# Patient Record
Sex: Female | Born: 1990 | Race: Black or African American | Hispanic: No | Marital: Married | Smoking: Never smoker
Health system: Southern US, Community
[De-identification: ages and names within clinical notes are randomized; demographics above are authoritative.]

## PROBLEM LIST (undated history)

## (undated) ENCOUNTER — Inpatient Hospital Stay (HOSPITAL_COMMUNITY): Payer: Self-pay

## (undated) DIAGNOSIS — O139 Gestational [pregnancy-induced] hypertension without significant proteinuria, unspecified trimester: Secondary | ICD-10-CM

## (undated) DIAGNOSIS — Z789 Other specified health status: Secondary | ICD-10-CM

## (undated) HISTORY — DX: Gestational (pregnancy-induced) hypertension without significant proteinuria, unspecified trimester: O13.9

## (undated) HISTORY — PX: NO PAST SURGERIES: SHX2092

---

## 2012-01-06 ENCOUNTER — Encounter (HOSPITAL_COMMUNITY): Payer: Self-pay | Admitting: Emergency Medicine

## 2012-01-06 ENCOUNTER — Emergency Department (HOSPITAL_COMMUNITY)
Admission: EM | Admit: 2012-01-06 | Discharge: 2012-01-07 | Disposition: A | Discharge status: Home / Self Care | Attending: Emergency Medicine | Admitting: Emergency Medicine

## 2012-01-06 DIAGNOSIS — Z3202 Encounter for pregnancy test, result negative: Secondary | ICD-10-CM | POA: Insufficient documentation

## 2012-01-06 DIAGNOSIS — Z791 Long term (current) use of non-steroidal anti-inflammatories (NSAID): Secondary | ICD-10-CM | POA: Insufficient documentation

## 2012-01-06 DIAGNOSIS — R112 Nausea with vomiting, unspecified: Secondary | ICD-10-CM

## 2012-01-06 DIAGNOSIS — N760 Acute vaginitis: Secondary | ICD-10-CM | POA: Insufficient documentation

## 2012-01-06 DIAGNOSIS — N390 Urinary tract infection, site not specified: Secondary | ICD-10-CM | POA: Insufficient documentation

## 2012-01-06 LAB — CBC WITH DIFFERENTIAL/PLATELET
Basophils Absolute: 0 10*3/uL (ref 0.0–0.1)
Basophils Relative: 0 % (ref 0–1)
Eosinophils Absolute: 0.1 10*3/uL (ref 0.0–0.7)
Hemoglobin: 10.6 g/dL — ABNORMAL LOW (ref 12.0–15.0)
MCH: 25.9 pg — ABNORMAL LOW (ref 26.0–34.0)
MCHC: 32.9 g/dL (ref 30.0–36.0)
Neutro Abs: 4.4 10*3/uL (ref 1.7–7.7)
Neutrophils Relative %: 54 % (ref 43–77)
Platelets: 430 10*3/uL — ABNORMAL HIGH (ref 150–400)
RDW: 14.4 % (ref 11.5–15.5)

## 2012-01-06 LAB — URINALYSIS, MICROSCOPIC ONLY
Nitrite: NEGATIVE
Protein, ur: 30 mg/dL — AB
Specific Gravity, Urine: 1.015 (ref 1.005–1.030)
Urobilinogen, UA: 1 mg/dL (ref 0.0–1.0)

## 2012-01-06 LAB — WET PREP, GENITAL
Trich, Wet Prep: NONE SEEN
Yeast Wet Prep HPF POC: NONE SEEN

## 2012-01-06 LAB — POCT PREGNANCY, URINE: Preg Test, Ur: NEGATIVE

## 2012-01-06 LAB — BASIC METABOLIC PANEL
CO2: 25 mEq/L (ref 19–32)
Chloride: 105 mEq/L (ref 96–112)
Creatinine, Ser: 0.71 mg/dL (ref 0.50–1.10)

## 2012-01-06 MED ORDER — ONDANSETRON 4 MG PO TBDP
4.0000 mg | ORAL_TABLET | Freq: Once | ORAL | Status: AC
  Administered 2012-01-06: 4 mg via ORAL
  Filled 2012-01-06: qty 1

## 2012-01-06 MED ORDER — HYDROCODONE-ACETAMINOPHEN 5-325 MG PO TABS
2.0000 | ORAL_TABLET | Freq: Once | ORAL | Status: AC
  Administered 2012-01-06: 2 via ORAL
  Filled 2012-01-06: qty 2

## 2012-01-06 NOTE — ED Notes (Signed)
Pt sts that she started to have general pain "all over" that started today. Pt also has been vomiting today. Denies any abd pain. Pt is currently on her menstrual cycle. Nothing makes pain better or worse. Pt has never had pain like this in the past.

## 2012-01-06 NOTE — ED Provider Notes (Signed)
History     CSN: [id number]  Arrival date & time 01/06/12  8295   First MD Initiated Contact with Patient 01/06/12 2244      Chief Complaint  Patient presents with  . Emesis    (Consider location/radiation/quality/duration/timing/severity/associated sxs/prior treatment) HPI Comments:  This is a 21 year old female, who presents emergency department with chief complaint of nausea and vomiting. Patient states that her symptoms have been improving over the course of the day. She denies chest pain, shortness of breath, abdominal pain, constipation, diarrhea, vaginal discharge, dysuria. She endorses nausea and vomiting. She has not tried anything to alleviate her symptoms. She has diffuse abdominal pain without rebound tenderness, no localized tenderness over McBurney's point, or upper right quadrant tenderness. Her pain is moderate.  The history is provided by the patient. No language interpreter was used.    History reviewed. No pertinent past medical history.  History reviewed. No pertinent past surgical history.  History reviewed. No pertinent family history.  History  Substance Use Topics  . Smoking status: Never Smoker   . Smokeless tobacco: Never Used  . Alcohol Use: No    OB History    Grav Para Term Preterm Abortions TAB SAB Ect Mult Living                  Review of Systems  All other systems reviewed and are negative.    Allergies  Review of patient's allergies indicates no known allergies.  Home Medications   Current Outpatient Rx  Name  Route  Sig  Dispense  Refill  . ACETAMINOPHEN 325 MG PO TABS   Oral   Take 650 mg by mouth every 6 (six) hours as needed. For fever/pain         . IBUPROFEN 200 MG PO CAPS   Oral   Take 1 capsule by mouth daily as needed. For pain/crampinf           BP 137/92  Pulse 81  Temp 98.3 F (36.8 C) (Oral)  Resp 18  SpO2 100%  LMP 01/05/2012  Physical Exam  Nursing note and vitals reviewed. Constitutional: She  is oriented to person, place, and time. She appears well-developed and well-nourished.  HENT:  Head: Normocephalic and atraumatic.  Eyes: Conjunctivae normal and EOM are normal. Pupils are equal, round, and reactive to light.  Neck: Normal range of motion. Neck supple.  Cardiovascular: Normal rate and regular rhythm.  Exam reveals no gallop and no friction rub.   No murmur heard. Pulmonary/Chest: Effort normal and breath sounds normal. No respiratory distress. She has no wheezes. She has no rales. She exhibits no tenderness.  Abdominal: Soft. Bowel sounds are normal. She exhibits no distension and no mass. There is no tenderness. There is no rebound and no guarding.  Genitourinary: There is no rash, tenderness, lesion or injury on the right labia. There is no rash, tenderness, lesion or injury on the left labia. Cervix exhibits no motion tenderness, no discharge and no friability. Right adnexum displays no mass, no tenderness and no fullness. Left adnexum displays no mass, no tenderness and no fullness. There is bleeding around the vagina. No erythema or tenderness around the vagina. No foreign body around the vagina. No signs of injury around the vagina. No vaginal discharge found.  Musculoskeletal: Normal range of motion. She exhibits no edema and no tenderness.  Neurological: She is alert and oriented to person, place, and time.  Skin: Skin is warm and dry.  Psychiatric: She has a  normal mood and affect. Her behavior is normal. Judgment and thought content normal.    ED Course  Procedures (including critical care time)  Labs Reviewed  CBC WITH DIFFERENTIAL - Abnormal; Notable for the following:    Hemoglobin 10.6 (*)     HCT 32.2 (*)     MCH 25.9 (*)     Platelets 430 (*)     All other components within normal limits  URINALYSIS, MICROSCOPIC ONLY - Abnormal; Notable for the following:    Color, Urine RED (*)  BIOCHEMICALS MAY BE AFFECTED BY COLOR   APPearance CLOUDY (*)     Hgb urine  dipstick LARGE (*)     Ketones, ur 15 (*)     Protein, ur 30 (*)     Leukocytes, UA SMALL (*)     Bacteria, UA FEW (*)     All other components within normal limits  POCT PREGNANCY, URINE  BASIC METABOLIC PANEL  OCCULT BLOOD, POC DEVICE  URINE CULTURE  WET PREP, GENITAL  GC/CHLAMYDIA PROBE AMP   Results for orders placed during the hospital encounter of 01/06/12  CBC WITH DIFFERENTIAL      Component Value Range   WBC 8.1  4.0 - 10.5 K/uL   RBC 4.10  3.87 - 5.11 MIL/uL   Hemoglobin 10.6 (*) 12.0 - 15.0 g/dL   HCT 16.1 (*) 09.6 - 04.5 %   MCV 78.5  78.0 - 100.0 fL   MCH 25.9 (*) 26.0 - 34.0 pg   MCHC 32.9  30.0 - 36.0 g/dL   RDW 40.9  81.1 - 91.4 %   Platelets 430 (*) 150 - 400 K/uL   Neutrophils Relative 54  43 - 77 %   Neutro Abs 4.4  1.7 - 7.7 K/uL   Lymphocytes Relative 34  12 - 46 %   Lymphs Abs 2.7  0.7 - 4.0 K/uL   Monocytes Relative 10  3 - 12 %   Monocytes Absolute 0.8  0.1 - 1.0 K/uL   Eosinophils Relative 2  0 - 5 %   Eosinophils Absolute 0.1  0.0 - 0.7 K/uL   Basophils Relative 0  0 - 1 %   Basophils Absolute 0.0  0.0 - 0.1 K/uL  URINALYSIS, MICROSCOPIC ONLY      Component Value Range   Color, Urine RED (*) YELLOW   APPearance CLOUDY (*) CLEAR   Specific Gravity, Urine 1.015  1.005 - 1.030   pH 7.0  5.0 - 8.0   Glucose, UA NEGATIVE  NEGATIVE mg/dL   Hgb urine dipstick LARGE (*) NEGATIVE   Bilirubin Urine NEGATIVE  NEGATIVE   Ketones, ur 15 (*) NEGATIVE mg/dL   Protein, ur 30 (*) NEGATIVE mg/dL   Urobilinogen, UA 1.0  0.0 - 1.0 mg/dL   Nitrite NEGATIVE  NEGATIVE   Leukocytes, UA SMALL (*) NEGATIVE   WBC, UA 3-6  <3 WBC/hpf   RBC / HPF TOO NUMEROUS TO COUNT  <3 RBC/hpf   Bacteria, UA FEW (*) RARE   Squamous Epithelial / LPF RARE  RARE  POCT PREGNANCY, URINE      Component Value Range   Preg Test, Ur NEGATIVE  NEGATIVE  BASIC METABOLIC PANEL      Component Value Range   Sodium 139  135 - 145 mEq/L   Potassium 4.0  3.5 - 5.1 mEq/L   Chloride 105  96 -  112 mEq/L   CO2 25  19 - 32 mEq/L   Glucose, Bld 83  70 - 99 mg/dL   BUN 13  6 - 23 mg/dL   Creatinine, Ser 1.61  0.50 - 1.10 mg/dL   Calcium 9.0  8.4 - 09.6 mg/dL   GFR calc non Af Amer >90  >90 mL/min   GFR calc Af Amer >90  >90 mL/min  WET PREP, GENITAL      Component Value Range   Yeast Wet Prep HPF POC NONE SEEN  NONE SEEN   Trich, Wet Prep NONE SEEN  NONE SEEN   Clue Cells Wet Prep HPF POC FEW (*) NONE SEEN   WBC, Wet Prep HPF POC FEW (*) NONE SEEN  OCCULT BLOOD, POC DEVICE      Component Value Range   Fecal Occult Bld NEGATIVE        1. UTI (lower urinary tract infection)   2. BV (bacterial vaginosis)   3. N&V (nausea and vomiting)       MDM  21 year old female with nausea and vomiting. Patient also has a UTI. Have discussed this patient with Dr. Preston Fleeting.  I'm going to discharge the patient to home with primary care followup. I will give her nausea and pain medication. Patient is stable and ready for discharge. Will treat UTI with Bactrim.  Will treat BV with metronidazole.  Will give a few norco and zofran for pain and nausea.  Patient encouraged to f/u with PCP or student health.        Roxy Horseman, PA-C 01/07/12 0008

## 2012-01-07 LAB — URINE CULTURE

## 2012-01-07 LAB — GC/CHLAMYDIA PROBE AMP: CT Probe RNA: NEGATIVE

## 2012-01-07 MED ORDER — METRONIDAZOLE 500 MG PO TABS: 500.0000 mg | ORAL_TABLET | Freq: Two times a day (BID) | ORAL | Status: DC

## 2012-01-07 MED ORDER — HYDROCODONE-ACETAMINOPHEN 5-325 MG PO TABS: 2.0000 | ORAL_TABLET | ORAL | Status: DC | PRN

## 2012-01-07 MED ORDER — SULFAMETHOXAZOLE-TRIMETHOPRIM 800-160 MG PO TABS: 1.0000 | ORAL_TABLET | Freq: Two times a day (BID) | ORAL | Status: DC

## 2012-01-07 MED ORDER — ONDANSETRON HCL 4 MG PO TABS: 4.0000 mg | ORAL_TABLET | Freq: Four times a day (QID) | ORAL | Status: DC

## 2012-01-07 NOTE — ED Provider Notes (Signed)
Medical screening examination/treatment/procedure(s) were performed by non-physician practitioner and as supervising physician I was immediately available for consultation/collaboration.   Graeme Menees, MD 01/07/12 1641 

## 2012-12-15 ENCOUNTER — Emergency Department (INDEPENDENT_AMBULATORY_CARE_PROVIDER_SITE_OTHER)
Admission: EM | Admit: 2012-12-15 | Discharge: 2012-12-15 | Disposition: A | Discharge status: Home / Self Care | Payer: BC Managed Care – PPO | Source: Home / Self Care | Attending: Family Medicine | Admitting: Family Medicine

## 2012-12-15 ENCOUNTER — Encounter (HOSPITAL_COMMUNITY): Payer: Self-pay | Admitting: Emergency Medicine

## 2012-12-15 DIAGNOSIS — O2691 Pregnancy related conditions, unspecified, first trimester: Secondary | ICD-10-CM

## 2012-12-15 DIAGNOSIS — O2690 Pregnancy related conditions, unspecified, unspecified trimester: Secondary | ICD-10-CM

## 2012-12-15 LAB — POCT URINALYSIS DIP (DEVICE)
Glucose, UA: NEGATIVE mg/dL
Hgb urine dipstick: NEGATIVE
Nitrite: NEGATIVE
Protein, ur: NEGATIVE mg/dL
Urobilinogen, UA: 0.2 mg/dL (ref 0.0–1.0)
pH: 7 (ref 5.0–8.0)

## 2012-12-15 NOTE — ED Provider Notes (Signed)
CSN: [id number]     Arrival date & time 12/15/12  1350 History   First MD Initiated Contact with Patient 12/15/12 1506     Chief Complaint  Patient presents with  . Nausea   (Consider location/radiation/quality/duration/timing/severity/associated sxs/prior Treatment) Patient is a 22 y.o. female presenting with female genitourinary complaint. The history is provided by the patient.  Female GU Problem This is a new problem. The current episode started more than 1 week ago. The problem has not changed since onset.Pertinent negatives include no chest pain and no abdominal pain. Associated symptoms comments: Nausea, no vomiting, no gu sx, no fever.Marland Kitchen    History reviewed. No pertinent past medical history. History reviewed. No pertinent past surgical history. History reviewed. No pertinent family history. History  Substance Use Topics  . Smoking status: Never Smoker   . Smokeless tobacco: Never Used  . Alcohol Use: No   OB History   Grav Para Term Preterm Abortions TAB SAB Ect Mult Living                 Review of Systems  Constitutional: Negative.   Cardiovascular: Negative for chest pain.  Gastrointestinal: Positive for nausea. Negative for vomiting, abdominal pain, diarrhea and constipation.  Genitourinary: Negative for urgency, vaginal bleeding, vaginal pain and pelvic pain.    Allergies  Review of patient's allergies indicates no known allergies.  Home Medications   Current Outpatient Rx  Name  Route  Sig  Dispense  Refill  . acetaminophen (TYLENOL) 325 MG tablet   Oral   Take 650 mg by mouth every 6 (six) hours as needed. For fever/pain         . HYDROcodone-acetaminophen (NORCO/VICODIN) 5-325 MG per tablet   Oral   Take 2 tablets by mouth every 4 (four) hours as needed for pain.   6 tablet   0   . Ibuprofen (MIDOL) 200 MG CAPS   Oral   Take 1 capsule by mouth daily as needed. For pain/crampinf         . metroNIDAZOLE (FLAGYL) 500 MG tablet   Oral   Take 1  tablet (500 mg total) by mouth 2 (two) times daily.   14 tablet   0   . ondansetron (ZOFRAN) 4 MG tablet   Oral   Take 1 tablet (4 mg total) by mouth every 6 (six) hours.   12 tablet   0   . sulfamethoxazole-trimethoprim (SEPTRA DS) 800-160 MG per tablet   Oral   Take 1 tablet by mouth every 12 (twelve) hours.   20 tablet   0    BP 129/57  Pulse 89  Temp(Src) 98.2 F (36.8 C) (Oral)  Resp 16  SpO2 100% Physical Exam  Nursing note and vitals reviewed. Constitutional: She is oriented to person, place, and time. She appears well-developed and well-nourished.  Abdominal: Soft. Bowel sounds are normal. She exhibits no distension and no mass. There is no tenderness. There is no rebound and no guarding.  Neurological: She is alert and oriented to person, place, and time.  Skin: Skin is warm and dry.    ED Course  Procedures (including critical care time) Labs Review Labs Reviewed  POCT URINALYSIS DIP (DEVICE) - Abnormal; Notable for the following:    Leukocytes, UA SMALL (*)    All other components within normal limits  POCT PREGNANCY, URINE - Abnormal; Notable for the following:    Preg Test, Ur POSITIVE (*)    All other components within normal limits  Imaging Review No results found.    MDM      Linna Hoff, MD 12/15/12 1540

## 2012-12-15 NOTE — ED Notes (Signed)
Pt  Reports      Vomiting         Mild  abd pain  Since  Last  Week            Pt  Reports  She  Is  Late on her  Period    Sexually  Active  No  Birth  Control

## 2013-01-12 ENCOUNTER — Encounter: Payer: Self-pay | Admitting: Obstetrics & Gynecology

## 2013-01-12 ENCOUNTER — Ambulatory Visit (INDEPENDENT_AMBULATORY_CARE_PROVIDER_SITE_OTHER): Payer: BC Managed Care – PPO | Admitting: Obstetrics & Gynecology

## 2013-01-12 VITALS — BP 108/78 | Temp 96.3°F | Ht 66.0 in | Wt 270.1 lb

## 2013-01-12 DIAGNOSIS — O99211 Obesity complicating pregnancy, first trimester: Secondary | ICD-10-CM

## 2013-01-12 DIAGNOSIS — E669 Obesity, unspecified: Secondary | ICD-10-CM

## 2013-01-12 DIAGNOSIS — Z3401 Encounter for supervision of normal first pregnancy, first trimester: Secondary | ICD-10-CM

## 2013-01-12 DIAGNOSIS — Z23 Encounter for immunization: Secondary | ICD-10-CM

## 2013-01-12 LAB — HIV ANTIBODY (ROUTINE TESTING W REFLEX): HIV: NONREACTIVE

## 2013-01-12 LAB — POCT URINALYSIS DIP (DEVICE)
Hgb urine dipstick: NEGATIVE
Nitrite: NEGATIVE
Protein, ur: NEGATIVE mg/dL
Urobilinogen, UA: 1 mg/dL (ref 0.0–1.0)
pH: 7 (ref 5.0–8.0)

## 2013-01-12 LAB — OB RESULTS CONSOLE GC/CHLAMYDIA
CHLAMYDIA, DNA PROBE: POSITIVE
Gonorrhea: NEGATIVE

## 2013-01-12 NOTE — Addendum Note (Signed)
Addended by: Louanna Raw on: 01/12/2013 04:14 PM   Modules accepted: Orders

## 2013-01-12 NOTE — Progress Notes (Signed)
Subjective:    Karen Christensen is being seen today for her first obstetrical visit.  This is not a planned pregnancy. She is at [redacted]w[redacted]d gestation. Her obstetrical history is significant for obesity. Relationship with FOB: significant other, not living together. Patient does intend to breast feed. Pregnancy history fully reviewed.  Menstrual History: OB History   Grav Para Term Preterm Abortions TAB SAB Ect Mult Living   1                Patient's last menstrual period was 11/11/2012.    The following portions of the patient's history were reviewed and updated as appropriate: allergies, current medications, past family history, past medical history, past social history, past surgical history and problem list.  Review of Systems A comprehensive review of systems was negative.    Objective:    BP 108/78  Temp(Src) 96.3 F (35.7 C)  Ht 5\' 6"  (1.676 m)  Wt 270 lb 1.6 oz (122.517 kg)  BMI 43.62 kg/m2  LMP 11/11/2012  General Appearance:    Alert, cooperative, no distress, appears stated age  Head:    Normocephalic, without obvious abnormality, atraumatic           Throat:   Lips, mucosa, and tongue normal; teeth and gums normal  Neck:   Supple, symmetrical, trachea midline, no adenopathy;    thyroid:  no enlargement/tenderness/nodules; no carotid   bruit or JVD; acanthosis nigricans  Back:     Symmetric, no curvature, ROM normal, no CVA tenderness  Lungs:     Clear to auscultation bilaterally, respirations unlabored  Chest Wall:    No tenderness or deformity   Heart:    Regular rate and rhythm, S1 and S2 normal, no murmur, rub   or gallop  Breast Exam:    No tenderness, masses, or nipple abnormality  Abdomen:     Soft, non-tender, bowel sounds active all four quadrants,    no masses, no organomegaly  Genitalia:    Normal female without lesion, discharge or tenderness  Rectal:    Normal tone, normal prostate, no masses or tenderness;   guaiac negative stool  Extremities:    Extremities normal, atraumatic, no cyanosis or edema  Pulses:   2+ and symmetric all extremities  Skin:   Skin color, texture, turgor normal, no rashes or lesions            Assessment:    Pregnancy at 8 and 6/7 weeks  Obesity- early 1 hr GTT done today    Plan:    Initial labs drawn. Prenatal vitamins. Problem list reviewed and updated. AFP3 discussed: requested. Role of ultrasound in pregnancy discussed; fetal survey: requested. Amniocentesis discussed: not indicated. Follow up in 4 weeks. 50% of 20 min visit spent on counseling and coordination of care.  Flu vac Early 1 hour GTT done today

## 2013-01-12 NOTE — Progress Notes (Signed)
P= 1001 Initiat prenatal visit, needs prescription for prenatal vitamins, c/o of cramping last Saturday denies any bleeding.

## 2013-01-12 NOTE — Patient Instructions (Addendum)
Pregnancy - First Trimester During sexual intercourse, millions of sperm go into the vagina. Only 1 sperm will penetrate and fertilize the female egg while it is in the Fallopian tube. One week later, the fertilized egg implants into the wall of the uterus. An embryo begins to develop into a baby. At 6 to 8 weeks, the eyes and face are formed and the heartbeat can be seen on ultrasound. At the end of 12 weeks (first trimester), all the baby's organs are formed. Now that you are pregnant, you will want to do everything you can to have a healthy baby. Two of the most important things are to get good prenatal care and follow your caregiver's instructions. Prenatal care is all the medical care you receive before the baby's birth. It is given to prevent, find, and treat problems during the pregnancy and childbirth. PRENATAL EXAMS  During prenatal visits, your weight, blood pressure, and urine are checked. This is done to make sure you are healthy and progressing normally during the pregnancy.  A pregnant woman should gain 25 to 35 pounds during the pregnancy. However, if you are overweight or underweight, your caregiver will advise you regarding your weight.  Your caregiver will ask and answer questions for you.  Blood work, cervical cultures, other necessary tests, and a Pap test are done during your prenatal exams. These tests are done to check on your health and the probable health of your baby. Tests are strongly recommended and done for HIV with your permission. This is the virus that causes AIDS. These tests are done because medicines can be given to help prevent your baby from being born with this infection should you have been infected without knowing it. Blood work is also used to find out your blood type, previous infections, and follow your blood levels (hemoglobin).  Low hemoglobin (anemia) is common during pregnancy. Iron and vitamins are given to help prevent this. Later in the pregnancy, blood  tests for diabetes will be done along with any other tests if any problems develop.  You may need other tests to make sure you and the baby are doing well. CHANGES DURING THE FIRST TRIMESTER  Your body goes through many changes during pregnancy. They vary from person to person. Talk to your caregiver about changes you notice and are concerned about. Changes can include:  Your menstrual period stops.  The egg and sperm carry the genes that determine what you look like. Genes from you and your partner are forming a baby. The female genes determine whether the baby is a boy or a girl.  Your body increases in girth and you may feel bloated.  Feeling sick to your stomach (nauseous) and throwing up (vomiting). If the vomiting is uncontrollable, call your caregiver.  Your breasts will begin to enlarge and become tender.  Your nipples may stick out more and become darker.  The need to urinate more. Painful urination may mean you have a bladder infection.  Tiring easily.  Loss of appetite.  Cravings for certain kinds of food.  At first, you may gain or lose a couple of pounds.  You may have changes in your emotions from day to day (excited to be pregnant or concerned something may go wrong with the pregnancy and baby).  You may have more vivid and strange dreams. HOME CARE INSTRUCTIONS   It is very important to avoid all smoking, alcohol and non-prescribed drugs during your pregnancy. These affect the formation and growth of the baby.   Avoid chemicals while pregnant to ensure the delivery of a healthy infant.  Start your prenatal visits by the 12th week of pregnancy. They are usually scheduled monthly at first, then more often in the last 2 months before delivery. Keep your caregiver's appointments. Follow your caregiver's instructions regarding medicine use, blood and lab tests, exercise, and diet.  During pregnancy, you are providing food for you and your baby. Eat regular, well-balanced  meals. Choose foods such as meat, fish, milk and other low fat dairy products, vegetables, fruits, and whole-grain breads and cereals. Your caregiver will tell you of the ideal weight gain.  You can help morning sickness by keeping soda crackers at the bedside. Eat a couple before arising in the morning. You may want to use the crackers without salt on them.  Eating 4 to 5 small meals rather than 3 large meals a day also may help the nausea and vomiting.  Drinking liquids between meals instead of during meals also seems to help nausea and vomiting.  A physical sexual relationship may be continued throughout pregnancy if there are no other problems. Problems may be early (premature) leaking of amniotic fluid from the membranes, vaginal bleeding, or belly (abdominal) pain.  Exercise regularly if there are no restrictions. Check with your caregiver or physical therapist if you are unsure of the safety of some of your exercises. Greater weight gain will occur in the last 2 trimesters of pregnancy. Exercising will help:  Control your weight.  Keep you in shape.  Prepare you for labor and delivery.  Help you lose your pregnancy weight after you deliver your baby.  Wear a good support or jogging bra for breast tenderness during pregnancy. This may help if worn during sleep too.  Ask when prenatal classes are available. Begin classes when they are offered.  Do not use hot tubs, steam rooms, or saunas.  Wear your seat belt when driving. This protects you and your baby if you are in an accident.  Avoid raw meat, uncooked cheese, cat litter boxes, and soil used by cats throughout the pregnancy. These carry germs that can cause birth defects in the baby.  The first trimester is a good time to visit your dentist for your dental health. Getting your teeth cleaned is okay. Use a softer toothbrush and brush gently during pregnancy.  Ask for help if you have financial, counseling, or nutritional needs  during pregnancy. Your caregiver will be able to offer counseling for these needs as well as refer you for other special needs.  Do not take any medicines or herbs unless told by your caregiver.  Inform your caregiver if there is any mental or physical domestic violence.  Make a list of emergency phone numbers of family, friends, hospital, and police and fire departments.  Write down your questions. Take them to your prenatal visit.  Do not douche.  Do not cross your legs.  If you have to stand for long periods of time, rotate you feet or take small steps in a circle.  You may have more vaginal secretions that may require a sanitary pad. Do not use tampons or scented sanitary pads. MEDICINES AND DRUG USE IN PREGNANCY  Take prenatal vitamins as directed. The vitamin should contain 1 milligram of folic acid. Keep all vitamins out of reach of children. Only a couple vitamins or tablets containing iron may be fatal to a baby or young child when ingested.  Avoid use of all medicines, including herbs, over-the-counter medicines, not   prescribed or suggested by your caregiver. Only take over-the-counter or prescription medicines for pain, discomfort, or fever as directed by your caregiver. Do not use aspirin, ibuprofen, or naproxen unless directed by your caregiver.  Let your caregiver also know about herbs you may be using.  Alcohol is related to a number of birth defects. This includes fetal alcohol syndrome. All alcohol, in any form, should be avoided completely. Smoking will cause low birth rate and premature babies.  Street or illegal drugs are very harmful to the baby. They are absolutely forbidden. A baby born to an addicted mother will be addicted at birth. The baby will go through the same withdrawal an adult does.  Let your caregiver know about any medicines that you have to take and for what reason you take them. SEEK MEDICAL CARE IF:  You have any concerns or worries during your  pregnancy. It is better to call with your questions if you feel they cannot wait, rather than worry about them. SEEK IMMEDIATE MEDICAL CARE IF:   An unexplained oral temperature above 102 F (38.9 C) develops, or as your caregiver suggests.  You have leaking of fluid from the vagina (birth canal). If leaking membranes are suspected, take your temperature and inform your caregiver of this when you call.  There is vaginal spotting or bleeding. Notify your caregiver of the amount and how many pads are used.  You develop a bad smelling vaginal discharge with a change in the color.  You continue to feel sick to your stomach (nauseated) and have no relief from remedies suggested. You vomit blood or coffee ground-like materials.  You lose more than 2 pounds of weight in 1 week.  You gain more than 2 pounds of weight in 1 week and you notice swelling of your face, hands, feet, or legs.  You gain 5 pounds or more in 1 week (even if you do not have swelling of your hands, face, legs, or feet).  You get exposed to [place] measles and have never had them.  You are exposed to fifth disease or chickenpox.  You develop belly (abdominal) pain. Round ligament discomfort is a common non-cancerous (benign) cause of abdominal pain in pregnancy. Your caregiver still must evaluate this.  You develop headache, fever, diarrhea, pain with urination, or shortness of breath.  You fall or are in a car accident or have any kind of trauma.  There is mental or physical violence in your home. Document Released: 01/29/2001 Document Revised: 10/30/2011 Document Reviewed: 08/02/2008 [facility] Patient Information 2014 [place], [place]. Glucose Tolerance Test During Pregnancy The glucose tolerance test (GTT) or 3-hour glucose test can be used to determine if a woman has diabetes that first begins or is first recognized during pregnancy (gestational diabetes). Typically, a GTT is done after you have had a 1-hour glucose  test with results that indicate you possibly have gestational diabetes.  The test takes about 3 hours. There will be a series of blood tests after you drink the sugar water solution. You must remain at the testing location to make sure that your blood is drawn on time.  LET YOUR CAREGIVER KNOW ABOUT:  Allergies to food or medicine.  Medicines taken, including vitamins, herbs, eyedrops, over-the-counter medicines, and creams.  Any recent illnesses or infections. BEFORE THE PROCEDURE The GTT is a fasting test, meaning you must stop eating for a certain amount of time. The test will be the most accurate if you have not eaten for 8 12 hours before  the test. For this reason, it is recommended that you have this test done in the morning before you have breakfast. PROCEDURE  Do not eat or drink anything but water during the test. When you arrive at the lab, a sample of your blood is taken to get your fasting blood glucose level. After your fasting glucose level is determined, you will be given a sugar water solution to drink. You will be asked to wait in a certain area until your next blood test. The blood tests are done each hour for 3 hours. Stay close to the lab so your blood samples can be taken on time. This is important. If the blood samples are not taken on time, the test will need to be done again on another day.  AFTER THE PROCEDURE  You can eat and drink as usual.   Ask when your test results will be ready. Make sure you get your test results. A positive test is considered when two of the four blood test values are equal or above the normal blood glucose level. Document Released: 08/06/2011 Document Reviewed: 08/06/2011 [facility] Patient Information 2014 [place], [place].

## 2013-01-13 ENCOUNTER — Encounter: Payer: Self-pay | Admitting: Obstetrics & Gynecology

## 2013-01-13 LAB — PRESCRIPTION MONITORING PROFILE (19 PANEL)
Carisoprodol, Urine: NEGATIVE ng/mL
Cocaine Metabolites: NEGATIVE ng/mL
Creatinine, Urine: 138.85 mg/dL (ref >20.0)
Fentanyl, Ur: NEGATIVE ng/mL
MDMA URINE: NEGATIVE ng/mL
Meperidine, Ur: NEGATIVE ng/mL
Nitrites, Initial: NEGATIVE ug/mL
Oxycodone Screen, Ur: NEGATIVE ng/mL
Propoxyphene: NEGATIVE ng/mL
Tramadol Scrn, Ur: NEGATIVE ng/mL
pH, Initial: 7.1 pH (ref 4.5–8.9)

## 2013-01-13 LAB — OBSTETRIC PANEL
Basophils Relative: 0 % (ref 0–1)
Eosinophils Absolute: 0.1 10*3/uL (ref 0.0–0.7)
Eosinophils Relative: 1 % (ref 0–5)
Hepatitis B Surface Ag: NEGATIVE
MCH: 24.2 pg — ABNORMAL LOW (ref 26.0–34.0)
MCHC: 32.6 g/dL (ref 30.0–36.0)
Neutrophils Relative %: 63 % (ref 43–77)
Platelets: 472 10*3/uL — ABNORMAL HIGH (ref 150–400)
Rh Type: POSITIVE

## 2013-01-13 LAB — CULTURE, OB URINE

## 2013-01-13 LAB — ALCOHOL METABOLITE (ETG), URINE: Ethyl Glucuronide (EtG): NEGATIVE ng/mL

## 2013-01-15 LAB — HEMOGLOBINOPATHY EVALUATION
Hemoglobin Other: 0 %
Hgb A2 Quant: 2.5 % (ref 2.2–3.2)
Hgb A: 97.5 % (ref 96.8–97.8)
Hgb S Quant: 0 %

## 2013-01-20 ENCOUNTER — Encounter: Payer: Self-pay | Admitting: *Deleted

## 2013-01-20 ENCOUNTER — Ambulatory Visit (HOSPITAL_COMMUNITY)
Admission: RE | Admit: 2013-01-20 | Discharge: 2013-01-20 | Disposition: A | Discharge status: Home / Self Care | Payer: BC Managed Care – PPO | Source: Ambulatory Visit | Attending: Obstetrics & Gynecology | Admitting: Obstetrics & Gynecology

## 2013-01-20 DIAGNOSIS — Z3401 Encounter for supervision of normal first pregnancy, first trimester: Secondary | ICD-10-CM

## 2013-01-20 DIAGNOSIS — Z3480 Encounter for supervision of other normal pregnancy, unspecified trimester: Secondary | ICD-10-CM | POA: Insufficient documentation

## 2013-01-20 DIAGNOSIS — E669 Obesity, unspecified: Secondary | ICD-10-CM | POA: Insufficient documentation

## 2013-01-20 DIAGNOSIS — O99211 Obesity complicating pregnancy, first trimester: Secondary | ICD-10-CM

## 2013-01-20 DIAGNOSIS — Z23 Encounter for immunization: Secondary | ICD-10-CM

## 2013-01-21 ENCOUNTER — Other Ambulatory Visit: Payer: Self-pay | Admitting: Obstetrics & Gynecology

## 2013-01-21 DIAGNOSIS — A749 Chlamydial infection, unspecified: Secondary | ICD-10-CM

## 2013-01-21 MED ORDER — AZITHROMYCIN 500 MG PO TABS: 1000.0000 mg | ORAL_TABLET | Freq: Once | ORAL | Status: DC

## 2013-01-22 ENCOUNTER — Telehealth: Payer: Self-pay

## 2013-01-22 NOTE — Telephone Encounter (Signed)
Message copied by Faythe Casa on Fri Jan 22, 2013 12:14 PM ------      Message from: Willodean Rosenthal      Created: Thu Jan 21, 2013  3:38 PM       Please call pt.  Needs tx for chlamydia.  rx sent to pharmacy. Needs repeat cx in 4weeks.            Thx,      clh-S       ------

## 2013-01-22 NOTE — Telephone Encounter (Signed)
Called pt and informed pt that she tested + for chlamydia and that an antibiotic has been sent to her pharmacy in which she will need to take all the pills at once.  I also informed her to please tell her partner or partner(s) to get treated as well.   Pt stated understanding.  STD card faxed to [facility].

## 2013-02-09 ENCOUNTER — Ambulatory Visit (INDEPENDENT_AMBULATORY_CARE_PROVIDER_SITE_OTHER): Payer: BC Managed Care – PPO | Admitting: Advanced Practice Midwife

## 2013-02-09 VITALS — BP 113/76 | Temp 97.4°F | Wt 264.8 lb

## 2013-02-09 DIAGNOSIS — Z23 Encounter for immunization: Secondary | ICD-10-CM

## 2013-02-09 DIAGNOSIS — O99211 Obesity complicating pregnancy, first trimester: Secondary | ICD-10-CM

## 2013-02-09 DIAGNOSIS — O2690 Pregnancy related conditions, unspecified, unspecified trimester: Secondary | ICD-10-CM

## 2013-02-09 DIAGNOSIS — E669 Obesity, unspecified: Secondary | ICD-10-CM

## 2013-02-09 LAB — POCT URINALYSIS DIP (DEVICE)
Bilirubin Urine: NEGATIVE
Hgb urine dipstick: NEGATIVE
Ketones, ur: NEGATIVE mg/dL
Protein, ur: NEGATIVE mg/dL
Specific Gravity, Urine: 1.02 (ref 1.005–1.030)
Urobilinogen, UA: 1 mg/dL (ref 0.0–1.0)
pH: 7 (ref 5.0–8.0)

## 2013-02-09 NOTE — Progress Notes (Signed)
Doing well.  Denies vaginal bleeding, LOF, regular contractions.  Desires AFP/Quad--to be drawn next visit.  Recommend increase PO fluids, rest/ice/heat/Tylenol for pain.

## 2013-02-09 NOTE — Progress Notes (Signed)
P= 100 C/o of intermittent lower abdominal/pelvic pressure.  

## 2013-02-18 NOTE — L&D Delivery Note (Signed)
Delivery Note At 3:40 AM a viable female was delivered via Vaginal, Spontaneous Delivery (Presentation: Left Occiput Anterior).  APGAR: 7, ; weight 6 lb 10.2 oz (3010 g).   Placenta status: Intact, Spontaneous.  Cord: 3 vessels with the following complications: None.  Cord pH: NA Anesthesia: Epidural  Episiotomy:  Lacerations: 2nd degree Suture Repair: 3.0 monocryl on CT Est. Blood Loss (mL): 250  Mom to postpartum.  Baby to Couplet care / Skin to Skin.  Called to delivery. Mother pushed over 2nd degree tear. I arrived after Infant delivered to maternal abdomen. Cord clamped and cut. Active management of 3rd stage with traction and Pitocin started after I delivered Placenta intact with 3v cord. Tear repaired with 3.0 monocryl on CT in usual manner. [id number]. Counts correct. Hemostatic.   Tawana ScaleMichael Ryan Makyna Niehoff, MD OB Fellow 08/04/2013, 4:09 AM

## 2013-03-09 ENCOUNTER — Encounter: Payer: Self-pay | Admitting: Advanced Practice Midwife

## 2013-03-09 ENCOUNTER — Ambulatory Visit (INDEPENDENT_AMBULATORY_CARE_PROVIDER_SITE_OTHER): Payer: BC Managed Care – PPO | Admitting: Advanced Practice Midwife

## 2013-03-09 VITALS — BP 119/80 | Temp 97.2°F | Wt 267.1 lb

## 2013-03-09 DIAGNOSIS — Z3400 Encounter for supervision of normal first pregnancy, unspecified trimester: Secondary | ICD-10-CM

## 2013-03-09 DIAGNOSIS — E669 Obesity, unspecified: Secondary | ICD-10-CM

## 2013-03-09 DIAGNOSIS — O99211 Obesity complicating pregnancy, first trimester: Secondary | ICD-10-CM

## 2013-03-09 DIAGNOSIS — O99210 Obesity complicating pregnancy, unspecified trimester: Secondary | ICD-10-CM

## 2013-03-09 MED ORDER — ONDANSETRON HCL 4 MG PO TABS: 4.0000 mg | ORAL_TABLET | Freq: Every day | ORAL | Status: DC | PRN

## 2013-03-09 NOTE — Progress Notes (Signed)
Test of cure done today. New Rx for Zofran given, with warnings for constipation. May use Miralax PRN. Will schedule US for 20 weeks

## 2013-03-09 NOTE — Progress Notes (Signed)
P= 107,States felt fluttering a little for a few days, but none last few days. Still c/o nausea at times.

## 2013-03-09 NOTE — Patient Instructions (Signed)
Second Trimester of Pregnancy The second trimester is from week 13 through week 28, months 4 through 6. The second trimester is often a time when you feel your best. Your body has also adjusted to being pregnant, and you begin to feel better physically. Usually, morning sickness has lessened or quit completely, you may have more energy, and you may have an increase in appetite. The second trimester is also a time when the fetus is growing rapidly. At the end of the sixth month, the fetus is about 9 inches long and weighs about 1 pounds. You will likely begin to feel the baby move (quickening) between 18 and 20 weeks of the pregnancy. BODY CHANGES Your body goes through many changes during pregnancy. The changes vary from woman to woman.   Your weight will continue to increase. You will notice your lower abdomen bulging out.  You may begin to get stretch marks on your hips, abdomen, and breasts.  You may develop headaches that can be relieved by medicines approved by your caregiver.  You may urinate more often because the fetus is pressing on your bladder.  You may develop or continue to have heartburn as a result of your pregnancy.  You may develop constipation because certain hormones are causing the muscles that push waste through your intestines to slow down.  You may develop hemorrhoids or swollen, bulging veins (varicose veins).  You may have back pain because of the weight gain and pregnancy hormones relaxing your joints between the bones in your pelvis and as a result of a shift in weight and the muscles that support your balance.  Your breasts will continue to grow and be tender.  Your gums may bleed and may be sensitive to brushing and flossing.  Dark spots or blotches (chloasma, mask of pregnancy) may develop on your face. This will likely fade after the baby is born.  A dark line from your belly button to the pubic area (linea nigra) may appear. This will likely fade after the  baby is born. WHAT TO EXPECT AT YOUR PRENATAL VISITS During a routine prenatal visit:  You will be weighed to make sure you and the fetus are growing normally.  Your blood pressure will be taken.  Your abdomen will be measured to track your baby's growth.  The fetal heartbeat will be listened to.  Any test results from the previous visit will be discussed. Your caregiver may ask you:  How you are feeling.  If you are feeling the baby move.  If you have had any abnormal symptoms, such as leaking fluid, bleeding, severe headaches, or abdominal cramping.  If you have any questions. Other tests that may be performed during your second trimester include:  Blood tests that check for:  Low iron levels (anemia).  Gestational diabetes (between 24 and 28 weeks).  Rh antibodies.  Urine tests to check for infections, diabetes, or protein in the urine.  An ultrasound to confirm the proper growth and development of the baby.  An amniocentesis to check for possible genetic problems.  Fetal screens for spina bifida and Down syndrome. HOME CARE INSTRUCTIONS   Avoid all smoking, herbs, alcohol, and unprescribed drugs. These chemicals affect the formation and growth of the baby.  Follow your caregiver's instructions regarding medicine use. There are medicines that are either safe or unsafe to take during pregnancy.  Exercise only as directed by your caregiver. Experiencing uterine cramps is a good sign to stop exercising.  Continue to eat regular,   healthy meals.  Wear a good support bra for breast tenderness.  Do not use hot tubs, steam rooms, or saunas.  Wear your seat belt at all times when driving.  Avoid raw meat, uncooked cheese, cat litter boxes, and soil used by cats. These carry germs that can cause birth defects in the baby.  Take your prenatal vitamins.  Try taking a stool softener (if your caregiver approves) if you develop constipation. Eat more high-fiber foods,  such as fresh vegetables or fruit and whole grains. Drink plenty of fluids to keep your urine clear or pale yellow.  Take warm sitz baths to soothe any pain or discomfort caused by hemorrhoids. Use hemorrhoid cream if your caregiver approves.  If you develop varicose veins, wear support hose. Elevate your feet for 15 minutes, 3 4 times a day. Limit salt in your diet.  Avoid heavy lifting, wear low heel shoes, and practice good posture.  Rest with your legs elevated if you have leg cramps or low back pain.  Visit your dentist if you have not gone yet during your pregnancy. Use a soft toothbrush to brush your teeth and be gentle when you floss.  A sexual relationship may be continued unless your caregiver directs you otherwise.  Continue to go to all your prenatal visits as directed by your caregiver. SEEK MEDICAL CARE IF:   You have dizziness.  You have mild pelvic cramps, pelvic pressure, or nagging pain in the abdominal area.  You have persistent nausea, vomiting, or diarrhea.  You have a bad smelling vaginal discharge.  You have pain with urination. SEEK IMMEDIATE MEDICAL CARE IF:   You have a fever.  You are leaking fluid from your vagina.  You have spotting or bleeding from your vagina.  You have severe abdominal cramping or pain.  You have rapid weight gain or loss.  You have shortness of breath with chest pain.  You notice sudden or extreme swelling of your face, hands, ankles, feet, or legs.  You have not felt your baby move in over an hour.  You have severe headaches that do not go away with medicine.  You have vision changes. Document Released: 01/29/2001 Document Revised: 10/07/2012 Document Reviewed: 04/07/2012 [employer] Patient Information 2014 [employer].  

## 2013-03-10 LAB — GC/CHLAMYDIA PROBE AMP
CT Probe RNA: NEGATIVE
GC Probe RNA: NEGATIVE

## 2013-03-30 ENCOUNTER — Ambulatory Visit (HOSPITAL_COMMUNITY)
Admission: RE | Admit: 2013-03-30 | Discharge: 2013-03-30 | Disposition: A | Discharge status: Home / Self Care | Payer: BC Managed Care – PPO | Source: Ambulatory Visit | Attending: Advanced Practice Midwife | Admitting: Advanced Practice Midwife

## 2013-03-30 DIAGNOSIS — E669 Obesity, unspecified: Secondary | ICD-10-CM | POA: Insufficient documentation

## 2013-03-30 DIAGNOSIS — Z1389 Encounter for screening for other disorder: Secondary | ICD-10-CM | POA: Insufficient documentation

## 2013-03-30 DIAGNOSIS — O99211 Obesity complicating pregnancy, first trimester: Secondary | ICD-10-CM

## 2013-03-30 DIAGNOSIS — O358XX0 Maternal care for other (suspected) fetal abnormality and damage, not applicable or unspecified: Secondary | ICD-10-CM | POA: Insufficient documentation

## 2013-03-30 DIAGNOSIS — Z363 Encounter for antenatal screening for malformations: Secondary | ICD-10-CM | POA: Insufficient documentation

## 2013-03-30 DIAGNOSIS — O99210 Obesity complicating pregnancy, unspecified trimester: Secondary | ICD-10-CM

## 2013-04-02 ENCOUNTER — Encounter: Payer: Self-pay | Admitting: Advanced Practice Midwife

## 2013-04-02 DIAGNOSIS — O98811 Other maternal infectious and parasitic diseases complicating pregnancy, first trimester: Secondary | ICD-10-CM

## 2013-04-02 DIAGNOSIS — A749 Chlamydial infection, unspecified: Secondary | ICD-10-CM | POA: Insufficient documentation

## 2013-04-07 ENCOUNTER — Ambulatory Visit (INDEPENDENT_AMBULATORY_CARE_PROVIDER_SITE_OTHER): Payer: BC Managed Care – PPO | Admitting: Advanced Practice Midwife

## 2013-04-07 ENCOUNTER — Other Ambulatory Visit: Payer: Self-pay | Admitting: Advanced Practice Midwife

## 2013-04-07 VITALS — BP 112/78 | Temp 97.5°F | Wt 267.2 lb

## 2013-04-07 DIAGNOSIS — Z23 Encounter for immunization: Secondary | ICD-10-CM

## 2013-04-07 DIAGNOSIS — Z3400 Encounter for supervision of normal first pregnancy, unspecified trimester: Secondary | ICD-10-CM

## 2013-04-07 DIAGNOSIS — E669 Obesity, unspecified: Secondary | ICD-10-CM

## 2013-04-07 DIAGNOSIS — L02229 Furuncle of trunk, unspecified: Secondary | ICD-10-CM

## 2013-04-07 DIAGNOSIS — O99210 Obesity complicating pregnancy, unspecified trimester: Secondary | ICD-10-CM

## 2013-04-07 DIAGNOSIS — O2690 Pregnancy related conditions, unspecified, unspecified trimester: Secondary | ICD-10-CM

## 2013-04-07 DIAGNOSIS — O99212 Obesity complicating pregnancy, second trimester: Secondary | ICD-10-CM

## 2013-04-07 DIAGNOSIS — L02239 Carbuncle of trunk, unspecified: Secondary | ICD-10-CM

## 2013-04-07 LAB — POCT URINALYSIS DIP (DEVICE)
BILIRUBIN URINE: NEGATIVE
Glucose, UA: NEGATIVE mg/dL
HGB URINE DIPSTICK: NEGATIVE
Ketones, ur: NEGATIVE mg/dL
NITRITE: NEGATIVE
PH: 7 (ref 5.0–8.0)
Protein, ur: NEGATIVE mg/dL
Specific Gravity, Urine: 1.025 (ref 1.005–1.030)
Urobilinogen, UA: 1 mg/dL (ref 0.0–1.0)

## 2013-04-07 MED ORDER — SULFAMETHOXAZOLE-TMP DS 800-160 MG PO TABS: 1.0000 | ORAL_TABLET | Freq: Two times a day (BID) | ORAL | Status: DC

## 2013-04-07 MED ORDER — OXYCODONE-ACETAMINOPHEN 5-325 MG PO TABS: 1.0000 | ORAL_TABLET | Freq: Four times a day (QID) | ORAL | Status: DC | PRN

## 2013-04-07 NOTE — Progress Notes (Signed)
Pulse: 93

## 2013-04-07 NOTE — Progress Notes (Signed)
Doing well.  Feeling daily fetal movement, denies vaginal bleeding, LOF, cramping/contractions. Denies urinary symptoms. Has boil on right side of abdomen, above level of umbilicus and fundus, ~4cm in diameter causing pain.  Area is painful to touch, raised, without erythema or broken skin.   I&D performed using sterile technique.  Written informed consent was obtained.  Discussed complications and possible outcomes of procedure including recurrence of cyst, scarring leading to infecton, bleeding.  The area was prepped with Iodine and draped in a sterile manner. 1% Lidocaine (3 ml) was then used to infiltrate area on top of the cyst.  A 5 mm incision was made using a sterile scapel. Upon palpation of the mass, a moderate amount of bloody purulent drainage was expressed through the incision. A hemostat was used to break up loculations, which resulted in expression of more bloody purulent drainage.  Patient tolerated the procedure well, reported feeling improvement in pain.  Bactrim DS bid x 7 days and Percocet 5/325, 1-2 tabs Q 6 hours x10 tabs.

## 2013-04-07 NOTE — Progress Notes (Signed)
Called patient to let her know that her rx for percocet was at the clinic. Phone was busy. Rx at front desk.

## 2013-04-08 LAB — AFP, QUAD SCREEN
AFP: 33.5 IU/mL
Curr Gest Age: 21 wks.days
Down Syndrome Scr Risk Est: 1:6420 {titer}
HCG, Total: 11222 m[IU]/mL
INH: 108.2 pg/mL
Interpretation-AFP: NEGATIVE
MOM FOR AFP: 0.83
MOM FOR HCG: 1.21
MoM for INH: 0.78
Open Spina bifida: NEGATIVE
Tri 18 Scr Risk Est: NEGATIVE
uE3 Mom: 1.01
uE3 Value: 1.2 ng/mL

## 2013-04-09 ENCOUNTER — Encounter: Payer: Self-pay | Admitting: Advanced Practice Midwife

## 2013-04-25 ENCOUNTER — Encounter (HOSPITAL_COMMUNITY): Payer: Self-pay

## 2013-04-25 ENCOUNTER — Inpatient Hospital Stay (HOSPITAL_COMMUNITY)
Admission: AD | Admit: 2013-04-25 | Discharge: 2013-04-25 | Disposition: A | Discharge status: Home / Self Care | Payer: BC Managed Care – PPO | Source: Ambulatory Visit | Attending: Obstetrics & Gynecology | Admitting: Obstetrics & Gynecology

## 2013-04-25 DIAGNOSIS — O98819 Other maternal infectious and parasitic diseases complicating pregnancy, unspecified trimester: Secondary | ICD-10-CM

## 2013-04-25 DIAGNOSIS — O9989 Other specified diseases and conditions complicating pregnancy, childbirth and the puerperium: Principal | ICD-10-CM

## 2013-04-25 DIAGNOSIS — O99891 Other specified diseases and conditions complicating pregnancy: Secondary | ICD-10-CM | POA: Diagnosis not present

## 2013-04-25 DIAGNOSIS — O99211 Obesity complicating pregnancy, first trimester: Secondary | ICD-10-CM

## 2013-04-25 DIAGNOSIS — R109 Unspecified abdominal pain: Secondary | ICD-10-CM | POA: Diagnosis present

## 2013-04-25 DIAGNOSIS — N949 Unspecified condition associated with female genital organs and menstrual cycle: Secondary | ICD-10-CM

## 2013-04-25 DIAGNOSIS — A749 Chlamydial infection, unspecified: Secondary | ICD-10-CM

## 2013-04-25 DIAGNOSIS — O98811 Other maternal infectious and parasitic diseases complicating pregnancy, first trimester: Secondary | ICD-10-CM

## 2013-04-25 HISTORY — DX: Other specified health status: Z78.9

## 2013-04-25 LAB — URINE MICROSCOPIC-ADD ON

## 2013-04-25 LAB — URINALYSIS, ROUTINE W REFLEX MICROSCOPIC
BILIRUBIN URINE: NEGATIVE
Glucose, UA: NEGATIVE mg/dL
HGB URINE DIPSTICK: NEGATIVE
KETONES UR: 15 mg/dL — AB
Nitrite: NEGATIVE
PH: 7 (ref 5.0–8.0)
Protein, ur: NEGATIVE mg/dL
SPECIFIC GRAVITY, URINE: 1.025 (ref 1.005–1.030)
Urobilinogen, UA: 2 mg/dL — ABNORMAL HIGH (ref 0.0–1.0)

## 2013-04-25 NOTE — MAU Provider Note (Signed)
  History     CSN: [id number]  Arrival date and time: 04/25/13 1029   First Provider Initiated Contact with Patient 04/25/13 1139      Chief Complaint  Patient presents with  . Abdominal Pain   Abdominal Pain Pertinent negatives include no dysuria, fever, headaches, nausea or vomiting.    Having abdominal pain since this AM. Sharp, shooting to suprapubic area. No alleviating or aggravating factors. No VB, LOF. +FM.   OB History   Grav Para Term Preterm Abortions TAB SAB Ect Mult Living   1               Past Medical History  Diagnosis Date  . Medical history non-contributory     Past Surgical History  Procedure Laterality Date  . No past surgeries      Family History  Problem Relation Age of Onset  . Asthma Sister   . Cancer Maternal Grandmother   . Cancer Paternal Grandmother   . Stroke Neg Hx   . Heart disease Neg Hx     History  Substance Use Topics  . Smoking status: Never Smoker   . Smokeless tobacco: Never Used  . Alcohol Use: No    Allergies: No Known Allergies  Prescriptions prior to admission  Medication Sig Dispense Refill  . acetaminophen (TYLENOL) 325 MG tablet Take 650 mg by mouth every 6 (six) hours as needed for moderate pain or headache. For fever/pain      . Prenatal Vit-Fe Fumarate-FA (PRENATAL MULTIVITAMIN) TABS tablet Take 1 tablet by mouth daily at 12 noon.        Review of Systems  Constitutional: Negative for fever and chills.  Respiratory: Negative for shortness of breath.   Cardiovascular: Negative for chest pain.  Gastrointestinal: Positive for abdominal pain. Negative for nausea and vomiting.  Genitourinary: Negative for dysuria and urgency.  Neurological: Negative for headaches.   Physical Exam   Blood pressure 130/66, pulse 104, temperature 98.4 F (36.9 C), temperature source Oral, resp. rate 16, height 5\' 4"  (1.626 m), weight 119.296 kg (263 lb), last menstrual period 11/11/2012.  Physical Exam  Constitutional: She  is oriented to person, place, and time. She appears well-developed and well-nourished. No distress.  HENT:  Head: Normocephalic and atraumatic.  Cardiovascular: Normal rate and regular rhythm.   No murmur heard. Respiratory: She has no wheezes. She has no rales.  GI: There is no tenderness. There is no rebound and no guarding.  Neurological: She is alert and oriented to person, place, and time.  Skin: Skin is warm and dry.  Psychiatric: She has a normal mood and affect. Her behavior is normal.    MAU Course  Procedures FHR of 150 with doppler  Assessment and Plan  23 yo G1P0 @ 23wk4d presents with abdominal pain.  Round Ligament Pain  Plan: Reviewed pregnancy precautions Keep scheduled appointment [facility]  Michaelene SongHall, Jonathan C 04/25/2013, 11:55 AM   I examined pt and agree with documentation above and resident plan of care.  Pt also states that pain increases with walking and movement. [facility] Center[facility],WAL[facility]

## 2013-04-25 NOTE — Progress Notes (Signed)
Dr. Margo AyeHall notified pt in MAU for eval of pelvic pain, u/a collected. Will see pt in MAU.

## 2013-04-25 NOTE — MAU Note (Signed)
PT denies bleeding or vag d/c changes. Here for pelvic pain that is bilateral

## 2013-04-25 NOTE — MAU Note (Signed)
Pt presents with complaints of lower abdominal pain that started this morning. Denies any bleeding.

## 2013-04-28 ENCOUNTER — Encounter (HOSPITAL_COMMUNITY): Payer: Self-pay | Admitting: General Practice

## 2013-04-28 ENCOUNTER — Inpatient Hospital Stay (HOSPITAL_COMMUNITY)
Admission: AD | Admit: 2013-04-28 | Discharge: 2013-04-28 | Disposition: A | Discharge status: Home / Self Care | Payer: BC Managed Care – PPO | Source: Ambulatory Visit | Attending: Obstetrics & Gynecology | Admitting: Obstetrics & Gynecology

## 2013-04-28 DIAGNOSIS — E669 Obesity, unspecified: Secondary | ICD-10-CM

## 2013-04-28 DIAGNOSIS — O99210 Obesity complicating pregnancy, unspecified trimester: Secondary | ICD-10-CM

## 2013-04-28 DIAGNOSIS — O4690 Antepartum hemorrhage, unspecified, unspecified trimester: Secondary | ICD-10-CM | POA: Insufficient documentation

## 2013-04-28 DIAGNOSIS — O99211 Obesity complicating pregnancy, first trimester: Secondary | ICD-10-CM

## 2013-04-28 DIAGNOSIS — R109 Unspecified abdominal pain: Secondary | ICD-10-CM | POA: Diagnosis not present

## 2013-04-28 DIAGNOSIS — O98819 Other maternal infectious and parasitic diseases complicating pregnancy, unspecified trimester: Secondary | ICD-10-CM

## 2013-04-28 DIAGNOSIS — M549 Dorsalgia, unspecified: Secondary | ICD-10-CM | POA: Diagnosis not present

## 2013-04-28 DIAGNOSIS — N949 Unspecified condition associated with female genital organs and menstrual cycle: Secondary | ICD-10-CM | POA: Diagnosis not present

## 2013-04-28 DIAGNOSIS — A749 Chlamydial infection, unspecified: Secondary | ICD-10-CM

## 2013-04-28 DIAGNOSIS — O98811 Other maternal infectious and parasitic diseases complicating pregnancy, first trimester: Secondary | ICD-10-CM

## 2013-04-28 LAB — URINALYSIS, ROUTINE W REFLEX MICROSCOPIC
Bilirubin Urine: NEGATIVE
Glucose, UA: NEGATIVE mg/dL
HGB URINE DIPSTICK: NEGATIVE
Ketones, ur: NEGATIVE mg/dL
NITRITE: NEGATIVE
Protein, ur: NEGATIVE mg/dL
Specific Gravity, Urine: 1.015 (ref 1.005–1.030)
UROBILINOGEN UA: 0.2 mg/dL (ref 0.0–1.0)
pH: 7 (ref 5.0–8.0)

## 2013-04-28 LAB — URINE MICROSCOPIC-ADD ON

## 2013-04-28 MED ORDER — CYCLOBENZAPRINE HCL 5 MG PO TABS: 5.0000 mg | ORAL_TABLET | Freq: Three times a day (TID) | ORAL | Status: DC | PRN

## 2013-04-28 NOTE — MAU Note (Signed)
Patient states she has been having low back and lower abdominal pain since Sunday. States when she got up she had dark blood on tissue and in her panties. Not wearing a pad and no active bleeding at this time.

## 2013-04-28 NOTE — Discharge Instructions (Signed)

## 2013-04-28 NOTE — MAU Provider Note (Signed)
History     CSN: [id number]  Arrival date and time: 04/28/13 1053   First Provider Initiated Contact with Patient 04/28/13 1246      Chief Complaint  Patient presents with  . Abdominal Pain  . Back Pain  . Vaginal Bleeding   Abdominal Pain  Back Pain Associated symptoms include abdominal pain.  Vaginal Bleeding Associated symptoms include abdominal pain and back pain.   With light bleeding on underwear this morning and when wiping. Also with back pain, which is unchagned and was diagnosed as round ligament pain. No LOF, +FM. No dysuria. Nothing per vagina recently.    Past Medical History  Diagnosis Date  . Medical history non-contributory     Past Surgical History  Procedure Laterality Date  . No past surgeries      Family History  Problem Relation Age of Onset  . Asthma Sister   . Cancer Maternal Grandmother   . Cancer Paternal Grandmother   . Stroke Neg Hx   . Heart disease Neg Hx     History  Substance Use Topics  . Smoking status: Never Smoker   . Smokeless tobacco: Never Used  . Alcohol Use: No    Allergies: No Known Allergies  Prescriptions prior to admission  Medication Sig Dispense Refill  . acetaminophen (TYLENOL) 325 MG tablet Take 650 mg by mouth every 6 (six) hours as needed for moderate pain or headache. For fever/pain      . Prenatal Vit-Fe Fumarate-FA (PRENATAL MULTIVITAMIN) TABS tablet Take 1 tablet by mouth daily at 12 noon.        Review of Systems  Gastrointestinal: Positive for abdominal pain.  Genitourinary: Positive for vaginal bleeding.  Musculoskeletal: Positive for back pain.   Physical Exam   Blood pressure 125/60, pulse 98, temperature 98.1 F (36.7 C), temperature source Oral, resp. rate 16, height 5\' 4"  (1.626 m), weight 118.842 kg (262 lb), last menstrual period 11/11/2012, SpO2 97.00%.  Physical Exam  Constitutional: She is oriented to person, place, and time. She appears well-developed and well-nourished. No  distress.  Cardiovascular: Normal rate and regular rhythm.   Respiratory: Effort normal and breath sounds normal. No respiratory distress.  GI: She exhibits no mass. There is no rebound and no guarding.  Neurological: She is alert and oriented to person, place, and time.  Skin: Skin is warm and dry.   Results for orders placed during the hospital encounter of 04/28/13 (from the past 24 hour(s))  URINALYSIS, ROUTINE W REFLEX MICROSCOPIC     Status: Abnormal   Collection Time    04/28/13 11:15 AM      Result Value Ref Range   Color, Urine YELLOW  YELLOW   APPearance CLEAR  CLEAR   Specific Gravity, Urine 1.015  1.005 - 1.030   pH 7.0  5.0 - 8.0   Glucose, UA NEGATIVE  NEGATIVE mg/dL   Hgb urine dipstick NEGATIVE  NEGATIVE   Bilirubin Urine NEGATIVE  NEGATIVE   Ketones, ur NEGATIVE  NEGATIVE mg/dL   Protein, ur NEGATIVE  NEGATIVE mg/dL   Urobilinogen, UA 0.2  0.0 - 1.0 mg/dL   Nitrite NEGATIVE  NEGATIVE   Leukocytes, UA MODERATE (*) NEGATIVE  URINE MICROSCOPIC-ADD ON     Status: Abnormal   Collection Time    04/28/13 11:15 AM      Result Value Ref Range   Squamous Epithelial / LPF FEW (*) RARE   WBC, UA 3-6  <3 WBC/hpf   Bacteria, UA FEW (*) RARE  MAU Course  Procedures Speculum; physiologic discharge, no bleeding, non-erythematous vaginal walls. Cervix is posterior appears closed.  FHT: Baseline 140. Mod var. +accels, occ variables. Reassuring for 24 week fetus.  Toco: Some irritability, no definitive contractions.  Assessment and Plan  23 y.o. G1P0 @ [redacted]w[redacted]d presents for bleeding with back pain.   # Bleeding - Very light by report.   # Back pain - has been present for some time - conservative management with flexeril, tylenol  # Moderate LE on UA - appears dirt catch - culture and call/tx if bacturia present.   # RTC for routing [facility]  Michaelene Song 04/28/2013, 1:13 PM   I have seen and examined this patient and agree with above documentation in the resident's  note. 23 yo G1 @ [redacted]w[redacted]d presents with a single episode early today of a brown streak in underwear and then a little brown on her underwear. Never had any red bleeding or any since. Speculum exam neg today. Reassurance and bleeding precautions discussed.  Back pain and inguinal pain chronic and unchanged. Cont abdominal binder and tylenol.  Fu as scheduled in [facility].  FWB- reassuring for GA.    Rulon Abide, M.D. [facility] Fellow 04/28/2013 6:24 PM

## 2013-04-29 LAB — URINE CULTURE
Colony Count: >=100000
Special Requests: NORMAL

## 2013-05-02 ENCOUNTER — Encounter (HOSPITAL_COMMUNITY): Payer: Self-pay

## 2013-05-02 ENCOUNTER — Inpatient Hospital Stay (HOSPITAL_COMMUNITY)
Admission: AD | Admit: 2013-05-02 | Discharge: 2013-05-02 | Disposition: A | Discharge status: Home / Self Care | Payer: Medicaid Other | Source: Ambulatory Visit | Attending: Obstetrics & Gynecology | Admitting: Obstetrics & Gynecology

## 2013-05-02 DIAGNOSIS — K920 Hematemesis: Secondary | ICD-10-CM | POA: Insufficient documentation

## 2013-05-02 DIAGNOSIS — O212 Late vomiting of pregnancy: Secondary | ICD-10-CM | POA: Insufficient documentation

## 2013-05-02 DIAGNOSIS — O368190 Decreased fetal movements, unspecified trimester, not applicable or unspecified: Secondary | ICD-10-CM | POA: Insufficient documentation

## 2013-05-02 DIAGNOSIS — O368120 Decreased fetal movements, second trimester, not applicable or unspecified: Secondary | ICD-10-CM

## 2013-05-02 DIAGNOSIS — O219 Vomiting of pregnancy, unspecified: Secondary | ICD-10-CM

## 2013-05-02 LAB — URINALYSIS, ROUTINE W REFLEX MICROSCOPIC
Bilirubin Urine: NEGATIVE
Glucose, UA: NEGATIVE mg/dL
HGB URINE DIPSTICK: NEGATIVE
Ketones, ur: NEGATIVE mg/dL
Nitrite: NEGATIVE
PROTEIN: NEGATIVE mg/dL
Specific Gravity, Urine: 1.015 (ref 1.005–1.030)
Urobilinogen, UA: 1 mg/dL (ref 0.0–1.0)
pH: 8.5 — ABNORMAL HIGH (ref 5.0–8.0)

## 2013-05-02 LAB — URINE MICROSCOPIC-ADD ON

## 2013-05-02 LAB — CBC
HEMATOCRIT: 29.7 % — AB (ref 36.0–46.0)
Hemoglobin: 9.8 g/dL — ABNORMAL LOW (ref 12.0–15.0)
MCH: 26.8 pg (ref 26.0–34.0)
MCHC: 33 g/dL (ref 30.0–36.0)
MCV: 81.1 fL (ref 78.0–100.0)
Platelets: 306 10*3/uL (ref 150–400)
RBC: 3.66 MIL/uL — AB (ref 3.87–5.11)
RDW: 14.3 % (ref 11.5–15.5)
WBC: 7.2 10*3/uL (ref 4.0–10.5)

## 2013-05-02 MED ORDER — PROMETHAZINE HCL 25 MG PO TABS: 25.0000 mg | ORAL_TABLET | Freq: Four times a day (QID) | ORAL | Status: DC | PRN

## 2013-05-02 MED ORDER — SUCRALFATE 1 GM/10ML PO SUSP: 1.0000 g | Freq: Three times a day (TID) | ORAL | Status: DC

## 2013-05-02 MED ORDER — SUCRALFATE 1 GM/10ML PO SUSP
1.0000 g | Freq: Three times a day (TID) | ORAL | Status: DC
Start: 2013-05-02 — End: 2013-05-02
  Administered 2013-05-02: 1 g via ORAL
  Filled 2013-05-02 (×4): qty 10

## 2013-05-02 MED ORDER — PROMETHAZINE HCL 25 MG PO TABS
25.0000 mg | ORAL_TABLET | Freq: Once | ORAL | Status: AC
Start: 2013-05-02 — End: 2013-05-02
  Administered 2013-05-02: 25 mg via ORAL
  Filled 2013-05-02: qty 1

## 2013-05-02 NOTE — Discharge Instructions (Signed)
Hematemesis This condition is the vomiting of blood. CAUSES  This can happen if you have a peptic ulcer or an irritation of the throat, stomach, or small bowel. Vomiting over and over again or swallowing blood from a nosebleed, coughing or facial injury can also result in bloody vomit. Anti-inflammatory pain medicines are a common cause of this potentially dangerous condition. The most serious causes of vomiting blood include:  Ulcers (a bacteria called H. pylori is common cause of ulcers).  Clotting problems.  Alcoholism.  Cirrhosis. TREATMENT  Treatment depends on the cause and the severity of the bleeding. Small amounts of blood streaks in the vomit is not the same as vomiting large amounts of bloody or dark, coffee grounds-like material. Weakness, fainting, dehydration, anemia, and continued alcohol or drug use increase the risk. Examination may include blood, vomit, or stool tests. The presence of bloody or dark stool that tests positive for blood (Hemoccult) means the bleeding has been going on for some time. Endoscopy and imaging studies may be done. Emergency treatment may include:  IV medicines or fluids.  Blood transfusions.  Surgery. Hospital care is required for high risk patients or when IV fluids or blood is needed. Upper GI bleeding can cause shock and death if not controlled. HOME CARE INSTRUCTIONS   Your treatment does not require hospital care at this time.  Remain at rest until your condition improves.  Drink clear liquids as tolerated.  Avoid:  Alcohol.  Nicotine.  Aspirin.  Any other anti-inflammatory medicine (ibuprofen, naproxen, and many others).  Medications to suppress stomach acid or vomiting may be needed. Take all your medicine as prescribed.  Be sure to see your caregiver for follow-up as recommended. SEEK IMMEDIATE MEDICAL CARE IF:   You have repeated vomiting, dehydration, fainting, or extreme weakness.  You are vomiting large amounts of  bloody or dark material.  You pass large, dark or bloody stools. Document Released: 03/14/2004 Document Revised: 04/29/2011 Document Reviewed: 03/30/2008 [facility] Patient Information 2014 [place], [place].  Morning Sickness Morning sickness is when you feel sick to your stomach (nauseous) during pregnancy. This nauseous feeling may or may not come with vomiting. It often occurs in the morning but can be a problem any time of day. Morning sickness is most common during the first trimester, but it may continue throughout pregnancy. While morning sickness is unpleasant, it is usually harmless unless you develop severe and continual vomiting (hyperemesis gravidarum). This condition requires more intense treatment.  CAUSES  The cause of morning sickness is not completely known but seems to be related to normal hormonal changes that occur in pregnancy. RISK FACTORS You are at greater risk if you:  Experienced nausea or vomiting before your pregnancy.  Had morning sickness during a previous pregnancy.  Are pregnant with more than one baby, such as twins. TREATMENT  Do not use any medicines (prescription, over-the-counter, or herbal) for morning sickness without first talking to your health care provider. Your health care provider may prescribe or recommend:  Vitamin B6 supplements.  Anti-nausea medicines.  The herbal medicine ginger. HOME CARE INSTRUCTIONS   Only take over-the-counter or prescription medicines as directed by your health care provider.  Taking multivitamins before getting pregnant can prevent or decrease the severity of morning sickness in most women.   Eat a piece of dry toast or unsalted crackers before getting out of bed in the morning.   Eat five or six small meals a day.   Eat dry and bland foods (rice, baked  potato). Foods high in carbohydrates are often helpful.  Do not drink liquids with your meals. Drink liquids between meals.   Avoid greasy, fatty, and  spicy foods.   Get someone to cook for you if the smell of any food causes nausea and vomiting.   If you feel nauseous after taking prenatal vitamins, take the vitamins at night or with a snack.  Snack on protein foods (nuts, yogurt, cheese) between meals if you are hungry.   Eat unsweetened gelatins for desserts.   Wearing an acupressure wristband (worn for sea sickness) may be helpful.   Acupuncture may be helpful.   Do not smoke.   Get a humidifier to keep the air in your house free of odors.   Get plenty of fresh air. SEEK MEDICAL CARE IF:   Your home remedies are not working, and you need medicine.  You feel dizzy or lightheaded.  You are losing weight. SEEK IMMEDIATE MEDICAL CARE IF:   You have persistent and uncontrolled nausea and vomiting.  You pass out (faint). Document Released: 03/28/2006 Document Revised: 10/07/2012 Document Reviewed: 07/22/2012 [facility] Patient Information 2014 [place], [place].

## 2013-05-02 NOTE — Progress Notes (Signed)
Report given on pt in MAU. Will come see pt

## 2013-05-02 NOTE — MAU Provider Note (Signed)
Chief Complaint:  Emesis   First Provider Initiated Contact with Patient 05/02/13 1428      HPI: Karen Christensen is a 23 y.o. G1P0 at 6741w4d who presents to maternity admissions reporting vomiting twice in the past 24 hours and seeing a few 2-3 cm clots in the emesis. Also reports decreased FM.   N/V have been C/W that throughout the pregnancy, but the bleeding is new Denies bloody stools, nose bleeds, contractions, leakage of fluid or vaginal bleeding. Reports congestions from seasonal allergies. No Hx of GI problems. Mild nausea now.   Patient Active Problem List   Diagnosis Date Noted  . Chlamydia infection complicating pregnancy in first trimester 04/02/2013  . Obesity complicating pregnancy in first trimester 01/12/2013    Past Medical History: Past Medical History  Diagnosis Date  . Medical history non-contributory     Past obstetric history: OB History  Gravida Para Term Preterm AB SAB TAB Ectopic Multiple Living  1             # Outcome Date GA Lbr Len/2nd Weight Sex Delivery Anes PTL Lv  1 CUR               Past Surgical History: Past Surgical History  Procedure Laterality Date  . No past surgeries       Family History: Family History  Problem Relation Age of Onset  . Asthma Sister   . Cancer Maternal Grandmother   . Cancer Paternal Grandmother   . Stroke Neg Hx   . Heart disease Neg Hx     Social History: History  Substance Use Topics  . Smoking status: Never Smoker   . Smokeless tobacco: Never Used  . Alcohol Use: No    Allergies: No Known Allergies  Meds:  Prescriptions prior to admission  Medication Sig Dispense Refill  . Prenatal Vit-Fe Fumarate-FA (PRENATAL MULTIVITAMIN) TABS tablet Take 1 tablet by mouth daily at 12 noon.      Marland Kitchen. acetaminophen (TYLENOL) 325 MG tablet Take 650 mg by mouth every 6 (six) hours as needed for moderate pain or headache. For fever/pain      . cyclobenzaprine (FLEXERIL) 5 MG tablet Take 1 tablet (5 mg total) by mouth 3  (three) times daily as needed for muscle spasms.  30 tablet  0    ROS: Pertinent findings in history of present illness.  Physical Exam  Blood pressure 113/53, pulse 99, temperature 98 F (36.7 C), temperature source Oral, resp. rate 18, height 5\' 4"  (1.626 m), weight 121.11 kg (267 lb), last menstrual period 11/11/2012. GENERAL: Well-developed, well-nourished female in no acute distress. Normal color for race.  HEENT: normocephalic HEART: normal rate RESP: normal effort ABDOMEN: Soft, non-tender, gravid appropriate for gestational age NEURO: alert and oriented SPECULUM EXAM: Deferred  FHT:  Baseline 150 , moderate variability, accelerations present, no decelerations Contractions: none   Labs: Results for orders placed during the hospital encounter of 05/02/13 (from the past 24 hour(s))  URINALYSIS, ROUTINE W REFLEX MICROSCOPIC     Status: Abnormal   Collection Time    05/02/13 12:35 PM      Result Value Ref Range   Color, Urine YELLOW  YELLOW   APPearance CLEAR  CLEAR   Specific Gravity, Urine 1.015  1.005 - 1.030   pH 8.5 (*) 5.0 - 8.0   Glucose, UA NEGATIVE  NEGATIVE mg/dL   Hgb urine dipstick NEGATIVE  NEGATIVE   Bilirubin Urine NEGATIVE  NEGATIVE   Ketones, ur NEGATIVE  NEGATIVE  mg/dL   Protein, ur NEGATIVE  NEGATIVE mg/dL   Urobilinogen, UA 1.0  0.0 - 1.0 mg/dL   Nitrite NEGATIVE  NEGATIVE   Leukocytes, UA MODERATE (*) NEGATIVE  URINE MICROSCOPIC-ADD ON     Status: Abnormal   Collection Time    05/02/13 12:35 PM      Result Value Ref Range   Squamous Epithelial / LPF MANY (*) RARE   WBC, UA 7-10  <3 WBC/hpf   Bacteria, UA FEW (*) RARE   Urine-Other MUCOUS PRESENT    CBC     Status: Abnormal   Collection Time    05/02/13  2:50 PM      Result Value Ref Range   WBC 7.2  4.0 - 10.5 K/uL   RBC 3.66 (*) 3.87 - 5.11 MIL/uL   Hemoglobin 9.8 (*) 12.0 - 15.0 g/dL   HCT 13.0 (*) 86.5 - 78.4 %   MCV 81.1  78.0 - 100.0 fL   MCH 26.8  26.0 - 34.0 pg   MCHC 33.0  30.0 -  36.0 g/dL   RDW 69.6  29.5 - 28.4 %   Platelets 306  150 - 400 K/uL    Imaging:  No results found. MAU Course: Phenergan and Carafate ordered.   Per consult w/ Dr. Erin Fulling check H&H. Agrees w/ meds. No GI consult needed at this time.    Prev Hgb 4 months ago 10.3. No significant change. Baby now active.   Assessment: 1. Nausea and vomiting of pregnancy, antepartum   2. Hematemesis   3. Decreased fetal movement in pregnancy in second trimester, antepartum     Plan: Discharge home in stable condition. Return to MAU if Sx worsen. Take phenergan as needed to control vomiting and carafate on schedule.  Labor precautions and fetal kick counts Follow-up Information   Follow up with [facility] On 05/05/2013. (As scheduled)    Contact information:   [address] [place] [address]       Follow up with [facility] [place] MATERNITY ADMISSIONS. (As needed in emergencies)    Contact information:   [address] [id number] [place] [address] [phone]       Medication List         acetaminophen 325 MG tablet  Commonly known as:  TYLENOL  Take 650 mg by mouth every 6 (six) hours as needed for moderate pain or headache. For fever/pain     cyclobenzaprine 5 MG tablet  Commonly known as:  FLEXERIL  Take 1 tablet (5 mg total) by mouth 3 (three) times daily as needed for muscle spasms.     prenatal multivitamin Tabs tablet  Take 1 tablet by mouth daily at 12 noon.     promethazine 25 MG tablet  Commonly known as:  PHENERGAN  Take 1 tablet (25 mg total) by mouth every 6 (six) hours as needed for nausea or vomiting.     sucralfate 1 GM/10ML suspension  Commonly known as:  CARAFATE  Take 10 mLs (1 g total) by mouth 4 (four) times daily -  with meals and at bedtime.       [place], [place] 05/02/2013 3:29 PM

## 2013-05-02 NOTE — MAU Provider Note (Signed)
Attestation of Attending Supervision of Advanced Practitioner (CNM/NP): Evaluation and management procedures were performed by the Advanced Practitioner under my supervision and collaboration.  I have reviewed the Advanced Practitioner's note and chart, and I agree with the management and plan.  HARRAWAY-SMITH, Yordi Krager 3:37 PM

## 2013-05-02 NOTE — MAU Note (Signed)
Pt presents with complaints of vomiting since last night and she noticed some blood when she was vomiting this morning.

## 2013-05-05 ENCOUNTER — Ambulatory Visit (INDEPENDENT_AMBULATORY_CARE_PROVIDER_SITE_OTHER): Payer: BLUE CROSS/BLUE SHIELD | Admitting: Advanced Practice Midwife

## 2013-05-05 ENCOUNTER — Encounter (HOSPITAL_COMMUNITY): Payer: Self-pay

## 2013-05-05 ENCOUNTER — Encounter: Payer: Self-pay | Admitting: Advanced Practice Midwife

## 2013-05-05 ENCOUNTER — Inpatient Hospital Stay (HOSPITAL_COMMUNITY)
Admission: AD | Admit: 2013-05-05 | Discharge: 2013-05-05 | Disposition: A | Discharge status: Home / Self Care | Payer: Medicaid Other | Source: Ambulatory Visit | Attending: Obstetrics & Gynecology | Admitting: Obstetrics & Gynecology

## 2013-05-05 VITALS — BP 143/89 | Temp 97.6°F | Wt 258.9 lb

## 2013-05-05 DIAGNOSIS — O162 Unspecified maternal hypertension, second trimester: Secondary | ICD-10-CM

## 2013-05-05 DIAGNOSIS — O368190 Decreased fetal movements, unspecified trimester, not applicable or unspecified: Secondary | ICD-10-CM | POA: Insufficient documentation

## 2013-05-05 DIAGNOSIS — O9989 Other specified diseases and conditions complicating pregnancy, childbirth and the puerperium: Principal | ICD-10-CM

## 2013-05-05 DIAGNOSIS — O133 Gestational [pregnancy-induced] hypertension without significant proteinuria, third trimester: Secondary | ICD-10-CM | POA: Insufficient documentation

## 2013-05-05 DIAGNOSIS — O99210 Obesity complicating pregnancy, unspecified trimester: Secondary | ICD-10-CM

## 2013-05-05 DIAGNOSIS — E669 Obesity, unspecified: Secondary | ICD-10-CM

## 2013-05-05 DIAGNOSIS — O99211 Obesity complicating pregnancy, first trimester: Secondary | ICD-10-CM

## 2013-05-05 DIAGNOSIS — O139 Gestational [pregnancy-induced] hypertension without significant proteinuria, unspecified trimester: Secondary | ICD-10-CM

## 2013-05-05 DIAGNOSIS — O99891 Other specified diseases and conditions complicating pregnancy: Secondary | ICD-10-CM | POA: Insufficient documentation

## 2013-05-05 DIAGNOSIS — R030 Elevated blood-pressure reading, without diagnosis of hypertension: Secondary | ICD-10-CM | POA: Insufficient documentation

## 2013-05-05 LAB — COMPREHENSIVE METABOLIC PANEL
ALBUMIN: 2.9 g/dL — AB (ref 3.5–5.2)
ALT: 37 U/L — ABNORMAL HIGH (ref 0–35)
AST: 23 U/L (ref 0–37)
Alkaline Phosphatase: 57 U/L (ref 39–117)
BUN: 7 mg/dL (ref 6–23)
CO2: 22 mEq/L (ref 19–32)
CREATININE: 0.51 mg/dL (ref 0.50–1.10)
Calcium: 9.1 mg/dL (ref 8.4–10.5)
Chloride: 104 mEq/L (ref 96–112)
GFR calc Af Amer: >90 mL/min (ref >90)
GFR calc non Af Amer: >90 mL/min (ref >90)
Glucose, Bld: 85 mg/dL (ref 70–99)
Potassium: 3.8 mEq/L (ref 3.7–5.3)
Sodium: 138 mEq/L (ref 137–147)
TOTAL PROTEIN: 6.9 g/dL (ref 6.0–8.3)

## 2013-05-05 LAB — POCT URINALYSIS DIP (DEVICE)
Bilirubin Urine: NEGATIVE
Glucose, UA: NEGATIVE mg/dL
HGB URINE DIPSTICK: NEGATIVE
KETONES UR: NEGATIVE mg/dL
Nitrite: NEGATIVE
PH: 7 (ref 5.0–8.0)
PROTEIN: NEGATIVE mg/dL
SPECIFIC GRAVITY, URINE: 1.02 (ref 1.005–1.030)
Urobilinogen, UA: 1 mg/dL (ref 0.0–1.0)

## 2013-05-05 LAB — CBC
HEMATOCRIT: 30.9 % — AB (ref 36.0–46.0)
Hemoglobin: 10.3 g/dL — ABNORMAL LOW (ref 12.0–15.0)
MCH: 26.8 pg (ref 26.0–34.0)
MCHC: 33.3 g/dL (ref 30.0–36.0)
MCV: 80.5 fL (ref 78.0–100.0)
Platelets: 347 10*3/uL (ref 150–400)
RBC: 3.84 MIL/uL — ABNORMAL LOW (ref 3.87–5.11)
RDW: 14.1 % (ref 11.5–15.5)
WBC: 8.6 10*3/uL (ref 4.0–10.5)

## 2013-05-05 LAB — RAPID URINE DRUG SCREEN, HOSP PERFORMED
Amphetamines: NOT DETECTED
BARBITURATES: NOT DETECTED
Benzodiazepines: NOT DETECTED
Cocaine: NOT DETECTED
OPIATES: NOT DETECTED
Tetrahydrocannabinol: NOT DETECTED

## 2013-05-05 LAB — PROTEIN / CREATININE RATIO, URINE
CREATININE, URINE: 265.07 mg/dL
Protein Creatinine Ratio: 0.06 (ref 0.00–0.15)
TOTAL PROTEIN, URINE: 16.7 mg/dL

## 2013-05-05 NOTE — Progress Notes (Signed)
States has not felt the baby move since yesterday at all. + FHTs today. BP noted to be elevated today. Denies headache or vision changes. [facility]

## 2013-05-05 NOTE — MAU Provider Note (Signed)
None     Chief Complaint:  Hypertension   Karen Christensen is  23 y.o. G1P0 at [redacted]w[redacted]d presents complaining of Hypertension .  She states none contractions are associated with none vaginal bleeding, intact membranes, along with active fetal movement. Pt sent from clinic for elevated BPs and decreased fetal movement. BPs 140s/80s. Pt has had no headaches, vision changes, or RUQ pain, no VB, no LOF, no ctx. Fetal movement improved now.  Obstetrical/Gynecological History: OB History   Grav Para Term Preterm Abortions TAB SAB Ect Mult Living   1              Past Medical History: Past Medical History  Diagnosis Date  . Medical history non-contributory     Past Surgical History: Past Surgical History  Procedure Laterality Date  . No past surgeries      Family History: Family History  Problem Relation Age of Onset  . Asthma Sister   . Cancer Maternal Grandmother   . Cancer Paternal Grandmother   . Stroke Neg Hx   . Heart disease Neg Hx     Social History: History  Substance Use Topics  . Smoking status: Never Smoker   . Smokeless tobacco: Never Used  . Alcohol Use: No    Allergies: No Known Allergies  Meds:  Prescriptions prior to admission  Medication Sig Dispense Refill  . acetaminophen (TYLENOL) 325 MG tablet Take 650 mg by mouth every 6 (six) hours as needed for moderate pain or headache. For fever/pain      . cyclobenzaprine (FLEXERIL) 5 MG tablet Take 1 tablet (5 mg total) by mouth 3 (three) times daily as needed for muscle spasms.  30 tablet  0  . Prenatal Vit-Fe Fumarate-FA (PRENATAL MULTIVITAMIN) TABS tablet Take 1 tablet by mouth daily at 12 noon.      . promethazine (PHENERGAN) 25 MG tablet Take 1 tablet (25 mg total) by mouth every 6 (six) hours as needed for nausea or vomiting.  30 tablet  1    Review of Systems -   Review of Systems  No cp, sob, n/v, d/c, urinary or bowel symptoms. See HPI    Physical Exam  Blood pressure 138/63, pulse 109,  temperature 97.2 F (36.2 C), temperature source Oral, resp. rate 18, last menstrual period 11/11/2012. Filed Vitals:   05/05/13 1359 05/05/13 1414 05/05/13 1429 05/05/13 1444  BP: 134/74 129/65 139/74 138/63  Pulse: 91 85 97 109  Temp:      TempSrc:      Resp:         GENERAL: Well-developed, well-nourished female in no acute distress.  LUNGS: Clear to auscultation bilaterally.  HEART: Regular rate and rhythm. ABDOMEN: Soft, nontender, nondistended, gravid.  EXTREMITIES: Nontender, no edema, 2+ distal pulses. DTR's 2+  FHT:  Baseline rate 140s bpm   Variability moderate  Accelerations present 10x10  Decelerations none Contractions: none  Labs: Results for orders placed during the hospital encounter of 05/05/13 (from the past 24 hour(s))  PROTEIN / CREATININE RATIO, URINE   Collection Time    05/05/13  1:10 PM      Result Value Ref Range   Creatinine, Urine 265.07     Total Protein, Urine 16.7     PROTEIN CREATININE RATIO 0.06  0.00 - 0.15  URINE RAPID DRUG SCREEN (HOSP PERFORMED)   Collection Time    05/05/13  1:10 PM      Result Value Ref Range   Opiates NONE DETECTED  NONE DETECTED  Cocaine NONE DETECTED  NONE DETECTED   Benzodiazepines NONE DETECTED  NONE DETECTED   Amphetamines NONE DETECTED  NONE DETECTED   Tetrahydrocannabinol NONE DETECTED  NONE DETECTED   Barbiturates NONE DETECTED  NONE DETECTED  CBC   Collection Time    05/05/13  1:15 PM      Result Value Ref Range   WBC 8.6  4.0 - 10.5 K/uL   RBC 3.84 (*) 3.87 - 5.11 MIL/uL   Hemoglobin 10.3 (*) 12.0 - 15.0 g/dL   HCT 16.130.9 (*) 09.636.0 - 04.546.0 %   MCV 80.5  78.0 - 100.0 fL   MCH 26.8  26.0 - 34.0 pg   MCHC 33.3  30.0 - 36.0 g/dL   RDW 40.914.1  81.111.5 - 91.415.5 %   Platelets 347  150 - 400 K/uL  COMPREHENSIVE METABOLIC PANEL   Collection Time    05/05/13  1:15 PM      Result Value Ref Range   Sodium 138  137 - 147 mEq/L   Potassium 3.8  3.7 - 5.3 mEq/L   Chloride 104  96 - 112 mEq/L   CO2 22  19 - 32 mEq/L    Glucose, Bld 85  70 - 99 mg/dL   BUN 7  6 - 23 mg/dL   Creatinine, Ser 7.820.51  0.50 - 1.10 mg/dL   Calcium 9.1  8.4 - 95.610.5 mg/dL   Total Protein 6.9  6.0 - 8.3 g/dL   Albumin 2.9 (*) 3.5 - 5.2 g/dL   AST 23  0 - 37 U/L   ALT 37 (*) 0 - 35 U/L   Alkaline Phosphatase 57  39 - 117 U/L   Total Bilirubin <0.2 (*) 0.3 - 1.2 mg/dL   GFR calc non Af Amer >90  >90 mL/min   GFR calc Af Amer >90  >90 mL/min  POCT URINALYSIS DIP (DEVICE)   Collection Time    05/05/13 11:24 AM      Result Value Ref Range   Glucose, UA NEGATIVE  NEGATIVE mg/dL   Bilirubin Urine NEGATIVE  NEGATIVE   Ketones, ur NEGATIVE  NEGATIVE mg/dL   Specific Gravity, Urine 1.020  1.005 - 1.030   Hgb urine dipstick NEGATIVE  NEGATIVE   pH 7.0  5.0 - 8.0   Protein, ur NEGATIVE  NEGATIVE mg/dL   Urobilinogen, UA 1.0  0.0 - 1.0 mg/dL   Nitrite NEGATIVE  NEGATIVE   Leukocytes, UA SMALL (*) NEGATIVE   Imaging Studies:  No results found.  Assessment: Karen Christensen is  23 y.o. G1P0 at 10690w0d presents with elevated blood pressure consistent with likely GHTN based on GA. Unable to diagnose currently without 6 hrs between BPs.  - not meeting dx for Preeclampsia - not meeting threshold for medications at this time - reassuring and reactive 25wker - [facility] pt will need NST/AFI at 32 weeks  Ian Cavey RYAN 3/18/20153:24 PM

## 2013-05-05 NOTE — Discharge Instructions (Signed)
Hypertension During Pregnancy Hypertension is also called high blood pressure. It can occur at any time in life and during pregnancy. When you have hypertension, there is extra pressure inside your blood vessels that carry blood from the heart to the rest of your body (arteries). Hypertension during pregnancy can cause problems for you and your baby. Your baby might not weigh as much as it should at birth or might be born early (premature). Very bad cases of hypertension during pregnancy can be life threatening.  Different types of hypertension can occur during pregnancy.   Chronic hypertension. This happens when a woman has hypertension before pregnancy and it continues during pregnancy.  Gestational hypertension. This is when hypertension develops during pregnancy.  Preeclampsia or toxemia of pregnancy. This is a very serious type of hypertension that develops only during pregnancy. It is a disease that affects the whole body (systemic) and can be very dangerous for both mother and baby.  Gestational hypertension and preeclampsia usually go away after your baby is born. Blood pressure generally stabilizes within 6 weeks. Women who have hypertension during pregnancy have a greater chance of developing hypertension later in life or with future pregnancies. RISK FACTORS Some factors make you more likely to develop hypertension during pregnancy. Risk factors include:  Having hypertension before pregnancy.  Having hypertension during a previous pregnancy.  Being overweight.  Being older than 40.  Being pregnant with more than one baby (multiples).  Having diabetes or kidney problems. SIGNS AND SYMPTOMS Chronic and gestational hypertension rarely cause symptoms. Preeclampsia has symptoms, which may include:  Increased protein in your urine. Your health care provider will check for this at every prenatal visit.  Swelling of your hands and face.  Rapid weight gain.  Headaches.  Visual  changes.  Being bothered by light.  Abdominal pain, especially in the right upper area.  Chest pain.  Shortness of breath.  Increased reflexes.  Seizures. Seizures occur with a more severe form of preeclampsia, called eclampsia. DIAGNOSIS   You may be diagnosed with hypertension during a regular prenatal exam. At each visit, tests may include:  Blood pressure checks.  A urine test to check for protein in your urine.  The type of hypertension you are diagnosed with depends on when you developed it. It also depends on your specific blood pressure reading.  Developing hypertension before 20 weeks of pregnancy is consistent with chronic hypertension.  Developing hypertension after 20 weeks of pregnancy is consistent with gestational hypertension.  Hypertension with increased urinary protein is diagnosed as preeclampsia.  Blood pressure measurements that stay above 160 systolic or 110 diastolic are a sign of severe preeclampsia. TREATMENT Treatment for hypertension during pregnancy varies. Treatment depends on the type of hypertension and how serious it is.  If you take medicine for chronic hypertension, you may need to switch medicines.  Drugs called ACE inhibitors should not be taken during pregnancy.  Low-dose aspirin may be suggested for women who have risk factors for preeclampsia.  If you have gestational hypertension, you may need to take a blood pressure medicine that is safe during pregnancy. Your health care provider will recommend the appropriate medicine.  If you have severe preeclampsia, you may need to be in the hospital. Health care providers will watch you and the baby very closely. You also may need to take medicine (magnesium sulfate) to prevent seizures and lower blood pressure.  Sometimes an early delivery is needed. This may be the case if the condition worsens. It would   be done to protect you and the baby. The only cure for preeclampsia is delivery. HOME  CARE INSTRUCTIONS  Schedule and keep all of your regular appointments for prenatal care.  Only take over-the-counter or prescription medicines as directed by your health care provider. Tell your health care provider about all medicines you take.  Eat as little salt as possible.  Get regular exercise.  Do not drink alcohol.  Do not use tobacco products.  Do not drink products with caffeine.  Lie on your left side when resting. SEEK IMMEDIATE MEDICAL CARE IF:  You have severe abdominal pain.  You have sudden swelling in the hands, ankles, or face.  You gain 4 pounds (1.8 kg) or more in 1 week.  You vomit repeatedly.  You have vaginal bleeding.  You do not feel the baby moving as much.  You have a headache.  You have blurred or double vision.  You have muscle twitching or spasms.  You have shortness of breath.  You have blue fingernails and lips.  You have blood in your urine. MAKE SURE YOU:  Understand these instructions.  Will watch your condition.  Will get help right away if you are not doing well or get worse. Document Released: 10/23/2010 Document Revised: 11/25/2012 Document Reviewed: 09/03/2012 [employer] Patient Information 2014 [employer].  

## 2013-05-05 NOTE — Patient Instructions (Signed)
Hypertension During Pregnancy Hypertension is also called high blood pressure. It can occur at any time in life and during pregnancy. When you have hypertension, there is extra pressure inside your blood vessels that carry blood from the heart to the rest of your body (arteries). Hypertension during pregnancy can cause problems for you and your baby. Your baby might not weigh as much as it should at birth or might be born early (premature). Very bad cases of hypertension during pregnancy can be life threatening.  Different types of hypertension can occur during pregnancy.   Chronic hypertension. This happens when a woman has hypertension before pregnancy and it continues during pregnancy.  Gestational hypertension. This is when hypertension develops during pregnancy.  Preeclampsia or toxemia of pregnancy. This is a very serious type of hypertension that develops only during pregnancy. It is a disease that affects the whole body (systemic) and can be very dangerous for both mother and baby.  Gestational hypertension and preeclampsia usually go away after your baby is born. Blood pressure generally stabilizes within 6 weeks. Women who have hypertension during pregnancy have a greater chance of developing hypertension later in life or with future pregnancies. RISK FACTORS Some factors make you more likely to develop hypertension during pregnancy. Risk factors include:  Having hypertension before pregnancy.  Having hypertension during a previous pregnancy.  Being overweight.  Being older than 40.  Being pregnant with more than one baby (multiples).  Having diabetes or kidney problems. SIGNS AND SYMPTOMS Chronic and gestational hypertension rarely cause symptoms. Preeclampsia has symptoms, which may include:  Increased protein in your urine. Your health care provider will check for this at every prenatal visit.  Swelling of your hands and face.  Rapid weight gain.  Headaches.  Visual  changes.  Being bothered by light.  Abdominal pain, especially in the right upper area.  Chest pain.  Shortness of breath.  Increased reflexes.  Seizures. Seizures occur with a more severe form of preeclampsia, called eclampsia. DIAGNOSIS   You may be diagnosed with hypertension during a regular prenatal exam. At each visit, tests may include:  Blood pressure checks.  A urine test to check for protein in your urine.  The type of hypertension you are diagnosed with depends on when you developed it. It also depends on your specific blood pressure reading.  Developing hypertension before 20 weeks of pregnancy is consistent with chronic hypertension.  Developing hypertension after 20 weeks of pregnancy is consistent with gestational hypertension.  Hypertension with increased urinary protein is diagnosed as preeclampsia.  Blood pressure measurements that stay above 160 systolic or 110 diastolic are a sign of severe preeclampsia. TREATMENT Treatment for hypertension during pregnancy varies. Treatment depends on the type of hypertension and how serious it is.  If you take medicine for chronic hypertension, you may need to switch medicines.  Drugs called ACE inhibitors should not be taken during pregnancy.  Low-dose aspirin may be suggested for women who have risk factors for preeclampsia.  If you have gestational hypertension, you may need to take a blood pressure medicine that is safe during pregnancy. Your health care provider will recommend the appropriate medicine.  If you have severe preeclampsia, you may need to be in the hospital. Health care providers will watch you and the baby very closely. You also may need to take medicine (magnesium sulfate) to prevent seizures and lower blood pressure.  Sometimes an early delivery is needed. This may be the case if the condition worsens. It would   be done to protect you and the baby. The only cure for preeclampsia is delivery. HOME  CARE INSTRUCTIONS  Schedule and keep all of your regular appointments for prenatal care.  Only take over-the-counter or prescription medicines as directed by your health care provider. Tell your health care provider about all medicines you take.  Eat as little salt as possible.  Get regular exercise.  Do not drink alcohol.  Do not use tobacco products.  Do not drink products with caffeine.  Lie on your left side when resting. SEEK IMMEDIATE MEDICAL CARE IF:  You have severe abdominal pain.  You have sudden swelling in the hands, ankles, or face.  You gain 4 pounds (1.8 kg) or more in 1 week.  You vomit repeatedly.  You have vaginal bleeding.  You do not feel the baby moving as much.  You have a headache.  You have blurred or double vision.  You have muscle twitching or spasms.  You have shortness of breath.  You have blue fingernails and lips.  You have blood in your urine. MAKE SURE YOU:  Understand these instructions.  Will watch your condition.  Will get help right away if you are not doing well or get worse. Document Released: 10/23/2010 Document Revised: 11/25/2012 Document Reviewed: 09/03/2012 [employer] Patient Information 2014 [employer].  

## 2013-05-05 NOTE — Progress Notes (Signed)
P=102  Pt has had several visit to [facility] for nausea/vomiting.  Pt reports no fetal movement since yesterday am.

## 2013-05-05 NOTE — Progress Notes (Signed)
Notified of resulted lab work. Will come see pt

## 2013-05-05 NOTE — [facility] Provider Note (Signed)
Chief Complaint:  Hypertension  Karen Christensen is a 23 y.o.  G1P0 with IUP at [id number] presenting for Hypertension [facility] BP and decreased fetal movement x 1 day. She denies any HA, vision changes, RUQ ab pain, or LE edema. She denies any recent fevers, chills, or complications with this pregnancy.   Menstrual History: OB History   Grav Para Term Preterm Abortions TAB SAB Ect Mult Living   1               Patient's last menstrual period was 11/11/2012.      Past Medical History  Diagnosis Date  . Medical history non-contributory     Past Surgical History  Procedure Laterality Date  . No past surgeries      Family History  Problem Relation Age of Onset  . Asthma Sister   . Cancer Maternal Grandmother   . Cancer Paternal Grandmother   . Stroke Neg Hx   . Heart disease Neg Hx     History  Substance Use Topics  . Smoking status: Never Smoker   . Smokeless tobacco: Never Used  . Alcohol Use: No     No Known Allergies  Prescriptions prior to admission  Medication Sig Dispense Refill  . acetaminophen (TYLENOL) 325 MG tablet Take 650 mg by mouth every 6 (six) hours as needed for moderate pain or headache. For fever/pain      . cyclobenzaprine (FLEXERIL) 5 MG tablet Take 1 tablet (5 mg total) by mouth 3 (three) times daily as needed for muscle spasms.  30 tablet  0  . Prenatal Vit-Fe Fumarate-FA (PRENATAL MULTIVITAMIN) TABS tablet Take 1 tablet by mouth daily at 12 noon.      . promethazine (PHENERGAN) 25 MG tablet Take 1 tablet (25 mg total) by mouth every 6 (six) hours as needed for nausea or vomiting.  30 tablet  1    Review of Systems - Negative except for what is mentioned in HPI.  Physical Exam  Blood pressure 138/63, pulse 109, temperature 97.2 F (36.2 C), temperature source Oral, resp. rate 18, last menstrual period 11/11/2012. GENERAL: Well-developed, well-nourished female in no acute distress.  LUNGS: Clear to auscultation  bilaterally.  HEART: Regular rate and rhythm. ABDOMEN: Soft, nontender, nondistended, gravid.  EXTREMITIES: Nontender, no edema, 2+ distal pulses. FHT:  Baseline rate 150 bpm   Variability moderate  Accelerations: None   Decelerations none Contractions: None   Labs: Results for orders placed during the hospital encounter of 05/05/13 (from the past 24 hour(s))  PROTEIN / CREATININE RATIO, URINE   Collection Time    05/05/13  1:10 PM      Result Value Ref Range   Creatinine, Urine 265.07     Total Protein, Urine 16.7     PROTEIN CREATININE RATIO 0.06  0.00 - 0.15  URINE RAPID DRUG SCREEN (HOSP PERFORMED)   Collection Time    05/05/13  1:10 PM      Result Value Ref Range   Opiates NONE DETECTED  NONE DETECTED   Cocaine NONE DETECTED  NONE DETECTED   Benzodiazepines NONE DETECTED  NONE DETECTED   Amphetamines NONE DETECTED  NONE DETECTED   Tetrahydrocannabinol NONE DETECTED  NONE DETECTED   Barbiturates NONE DETECTED  NONE DETECTED  CBC   Collection Time    05/05/13  1:15 PM      Result Value Ref Range   WBC 8.6  4.0 - 10.5 K/uL   RBC 3.84 (*)  3.87 - 5.11 MIL/uL   Hemoglobin 10.3 (*) 12.0 - 15.0 g/dL   HCT 54.0 (*) 98.1 - 19.1 %   MCV 80.5  78.0 - 100.0 fL   MCH 26.8  26.0 - 34.0 pg   MCHC 33.3  30.0 - 36.0 g/dL   RDW 47.8  29.5 - 62.1 %   Platelets 347  150 - 400 K/uL  COMPREHENSIVE METABOLIC PANEL   Collection Time    05/05/13  1:15 PM      Result Value Ref Range   Sodium 138  137 - 147 mEq/L   Potassium 3.8  3.7 - 5.3 mEq/L   Chloride 104  96 - 112 mEq/L   CO2 22  19 - 32 mEq/L   Glucose, Bld 85  70 - 99 mg/dL   BUN 7  6 - 23 mg/dL   Creatinine, Ser 3.08  0.50 - 1.10 mg/dL   Calcium 9.1  8.4 - 65.7 mg/dL   Total Protein 6.9  6.0 - 8.3 g/dL   Albumin 2.9 (*) 3.5 - 5.2 g/dL   AST 23  0 - 37 U/L   ALT 37 (*) 0 - 35 U/L   Alkaline Phosphatase 57  39 - 117 U/L   Total Bilirubin <0.2 (*) 0.3 - 1.2 mg/dL   GFR calc non Af Amer >90  >90 mL/min   GFR calc Af Amer >90   >90 mL/min  POCT URINALYSIS DIP (DEVICE)   Collection Time    05/05/13 11:24 AM      Result Value Ref Range   Glucose, UA NEGATIVE  NEGATIVE mg/dL   Bilirubin Urine NEGATIVE  NEGATIVE   Ketones, ur NEGATIVE  NEGATIVE mg/dL   Specific Gravity, Urine 1.020  1.005 - 1.030   Hgb urine dipstick NEGATIVE  NEGATIVE   pH 7.0  5.0 - 8.0   Protein, ur NEGATIVE  NEGATIVE mg/dL   Urobilinogen, UA 1.0  0.0 - 1.0 mg/dL   Nitrite NEGATIVE  NEGATIVE   Leukocytes, UA SMALL (*) NEGATIVE    Imaging Studies:  No results found.  Assessment: Karen Christensen is  23 y.o. G1P0 at [redacted]w[redacted]d presents with Hypertension Blood pressure are wnl here. Preeclampsia labs all wnl. She remains asymptomatic. FHT is appropriate for gestational age.   Plan: Transiently Elevated BPs in Pregnancy - No clinical or laboratory signs of preeclampsia  - Continue current prenatal care and monitoring of BPs at [facility]  Wenda Low 3/18/20153:30 PM  I spoke with and examined patient and agree with resident's note and plan of care.  Tawana Scale, MD OB Fellow 05/05/2013 7:28 PM

## 2013-05-05 NOTE — [facility] Note (Signed)
Pt seen in clinic today, sent to [facility]U for PIH eval.  Pt denies HA, visual changes or abd pain.  No bleeding or LOF.

## 2013-05-10 ENCOUNTER — Encounter: Payer: Self-pay | Admitting: *Deleted

## 2013-05-20 ENCOUNTER — Encounter: Payer: Self-pay | Admitting: Family Medicine

## 2013-05-20 ENCOUNTER — Ambulatory Visit (INDEPENDENT_AMBULATORY_CARE_PROVIDER_SITE_OTHER): Payer: BC Managed Care – PPO | Admitting: Family Medicine

## 2013-05-20 VITALS — BP 135/86 | Temp 97.6°F | Wt 267.4 lb

## 2013-05-20 DIAGNOSIS — O99211 Obesity complicating pregnancy, first trimester: Secondary | ICD-10-CM

## 2013-05-20 DIAGNOSIS — Z23 Encounter for immunization: Secondary | ICD-10-CM

## 2013-05-20 DIAGNOSIS — O133 Gestational [pregnancy-induced] hypertension without significant proteinuria, third trimester: Secondary | ICD-10-CM

## 2013-05-20 DIAGNOSIS — O139 Gestational [pregnancy-induced] hypertension without significant proteinuria, unspecified trimester: Secondary | ICD-10-CM | POA: Diagnosis not present

## 2013-05-20 DIAGNOSIS — E669 Obesity, unspecified: Secondary | ICD-10-CM

## 2013-05-20 DIAGNOSIS — O99210 Obesity complicating pregnancy, unspecified trimester: Secondary | ICD-10-CM | POA: Diagnosis not present

## 2013-05-20 DIAGNOSIS — O162 Unspecified maternal hypertension, second trimester: Secondary | ICD-10-CM

## 2013-05-20 LAB — POCT URINALYSIS DIP (DEVICE)
Bilirubin Urine: NEGATIVE
Glucose, UA: NEGATIVE mg/dL
Hgb urine dipstick: NEGATIVE
KETONES UR: NEGATIVE mg/dL
Nitrite: NEGATIVE
Protein, ur: 30 mg/dL — AB
Specific Gravity, Urine: 1.025 (ref 1.005–1.030)
Urobilinogen, UA: 2 mg/dL — ABNORMAL HIGH (ref 0.0–1.0)
pH: 7 (ref 5.0–8.0)

## 2013-05-20 LAB — CBC
HCT: 30.2 % — ABNORMAL LOW (ref 36.0–46.0)
Hemoglobin: 9.8 g/dL — ABNORMAL LOW (ref 12.0–15.0)
MCH: 25.8 pg — ABNORMAL LOW (ref 26.0–34.0)
MCHC: 32.5 g/dL (ref 30.0–36.0)
MCV: 79.5 fL (ref 78.0–100.0)
PLATELETS: 354 10*3/uL (ref 150–400)
RBC: 3.8 MIL/uL — ABNORMAL LOW (ref 3.87–5.11)
RDW: 14.6 % (ref 11.5–15.5)
WBC: 7.6 10*3/uL (ref 4.0–10.5)

## 2013-05-20 MED ORDER — TETANUS-DIPHTH-ACELL PERTUSSIS 5-2.5-18.5 LF-MCG/0.5 IM SUSP: 0.5000 mL | Freq: Once | INTRAMUSCULAR | Status: DC

## 2013-05-20 NOTE — Patient Instructions (Signed)
Preeclampsia and Eclampsia Preeclampsia is a condition of high blood pressure during pregnancy. It can happen at 20 weeks or later in pregnancy. If high blood pressure occurs in the second half of pregnancy with no other symptoms, it is called gestational hypertension and goes away after the baby is born. If any of the symptoms listed below develop with gestational hypertension, it is then called preeclampsia. Eclampsia (convulsions) may follow preeclampsia. This is one of the reasons for regular prenatal checkups. Early diagnosis and treatment are very important to prevent eclampsia. CAUSES  There is no known cause of preeclampsia/eclampsia in pregnancy. There are several known conditions that may put the pregnant woman at risk, such as:  The first pregnancy.  Having preeclampsia in a past pregnancy.  Having lasting (chronic) high blood pressure.  Having multiples (twins, triplets).  Being age 35 or older.  African American ethnic background.  Having kidney disease or diabetes.  Medical conditions such as lupus or blood diseases.  Being overweight (obese). SYMPTOMS   High blood pressure.  Headaches.  Sudden weight gain.  Swelling of hands, face, legs, and feet.  Protein in the urine.  Feeling sick to your stomach (nauseous) and throwing up (vomiting).  Vision problems (blurred or double vision).  Numbness in the face, arms, legs, and feet.  Dizziness.  Slurred speech.  Preeclampsia can cause growth retardation in the fetus.  Separation (abruption) of the placenta.  Not enough fluid in the amniotic sac (oligohydramnios).  Sensitivity to bright lights.  Belly (abdominal) pain. DIAGNOSIS  If protein is found in the urine in the second half of pregnancy, this is considered preeclampsia. Other symptoms mentioned above may also be present. TREATMENT  It is necessary to treat this.  Your caregiver may prescribe bed rest early in this condition. Plenty of rest and  salt restriction may be all that is needed.  Medicines may be necessary to lower blood pressure if the condition does not respond to more conservative measures.  In more severe cases, hospitalization may be needed:  For treatment of blood pressure.  To control fluid retention.  To monitor the baby to see if the condition is causing harm to the baby.  Hospitalization is the best way to treat the first sign of preeclampsia. This is so the mother and baby can be watched closely and blood tests can be done effectively and correctly.  If the condition becomes severe, it may be necessary to induce labor or to remove the infant by surgical means (cesarean section). The best cure for preeclampsia/eclampsia is to deliver the baby. Preeclampsia and eclampsia involve risks to mother and infant. Your caregiver will discuss these risks with you. Together, you can work out the best possible approach to your problems. Make sure you keep your prenatal visits as scheduled. Not keeping appointments could result in a chronic or permanent injury, pain, disability to you, and death or injury to you or your unborn baby. If there is any problem keeping the appointment, you must call to reschedule. HOME CARE INSTRUCTIONS   Keep your prenatal appointments and tests as scheduled.  Tell your caregiver if you have any of the above risk factors.  Get plenty of rest and sleep.  Eat a balanced diet that is low in salt, and do not add salt to your food.  Avoid stressful situations.  Only take over-the-counter and prescriptions medicines for pain, discomfort, or fever as directed by your caregiver. SEEK IMMEDIATE MEDICAL CARE IF:   You develop severe swelling   anywhere in the body. This usually occurs in the legs.  You gain 05 lb/2.3 kg or more in a week.  You develop a severe headache, dizziness, problems with your vision, or confusion.  You have abdominal pain, nausea, or vomiting.  You have a seizure.  You  have trouble moving any part of your body, or you develop numbness or problems speaking.  You have bruising or abnormal bleeding from anywhere in the body.  You develop a stiff neck.  You pass out. MAKE SURE YOU:   Understand these instructions.  Will watch your condition.  Will get help right away if you are not doing well or get worse. Document Released: 02/02/2000 Document Revised: 04/29/2011 Document Reviewed: 09/18/2007 [employer] Patient Information 2014 [employer].  Breastfeeding Deciding to breastfeed is one of the best choices you can make for you and your baby. A change in hormones during pregnancy causes your breast tissue to grow and increases the number and size of your milk ducts. These hormones also allow proteins, sugars, and fats from your blood supply to make breast milk in your milk-producing glands. Hormones prevent breast milk from being released before your baby is born as well as prompt milk flow after birth. Once breastfeeding has begun, thoughts of your baby, as well as his or her sucking or crying, can stimulate the release of milk from your milk-producing glands.  BENEFITS OF BREASTFEEDING For Your Baby  Your first milk (colostrum) helps your baby's digestive system function better.   There are antibodies in your milk that help your baby fight off infections.   Your baby has a lower incidence of asthma, allergies, and sudden infant death syndrome.   The nutrients in breast milk are better for your baby than infant formulas and are designed uniquely for your baby's needs.   Breast milk improves your baby's brain development.   Your baby is less likely to develop other conditions, such as childhood obesity, asthma, or type 2 diabetes mellitus.  For You   Breastfeeding helps to create a very special bond between you and your baby.   Breastfeeding is convenient. Breast milk is always available at the correct temperature and costs nothing.    Breastfeeding helps to burn calories and helps you lose the weight gained during pregnancy.   Breastfeeding makes your uterus contract to its prepregnancy size faster and slows bleeding (lochia) after you give birth.   Breastfeeding helps to lower your risk of developing type 2 diabetes mellitus, osteoporosis, and breast or ovarian cancer later in life. SIGNS THAT YOUR BABY IS HUNGRY Early Signs of Hunger  Increased alertness or activity.  Stretching.  Movement of the head from side to side.  Movement of the head and opening of the mouth when the corner of the mouth or cheek is stroked (rooting).  Increased sucking sounds, smacking lips, cooing, sighing, or squeaking.  Hand-to-mouth movements.  Increased sucking of fingers or hands. Late Signs of Hunger  Fussing.  Intermittent crying. Extreme Signs of Hunger Signs of extreme hunger will require calming and consoling before your baby will be able to breastfeed successfully. Do not wait for the following signs of extreme hunger to occur before you initiate breastfeeding:   Restlessness.  A loud, strong cry.   Screaming. BREASTFEEDING BASICS Breastfeeding Initiation  Find a comfortable place to sit or lie down, with your neck and back well supported.  Place a pillow or rolled up blanket under your baby to bring him or her to the   level of your breast (if you are seated). Nursing pillows are specially designed to help support your arms and your baby while you breastfeed.  Make sure that your baby's abdomen is facing your abdomen.   Gently massage your breast. With your fingertips, massage from your chest wall toward your nipple in a circular motion. This encourages milk flow. You may need to continue this action during the feeding if your milk flows slowly.  Support your breast with 4 fingers underneath and your thumb above your nipple. Make sure your fingers are well away from your nipple and your baby's mouth.    Stroke your baby's lips gently with your finger or nipple.   When your baby's mouth is open wide enough, quickly bring your baby to your breast, placing your entire nipple and as much of the colored area around your nipple (areola) as possible into your baby's mouth.   More areola should be visible above your baby's upper lip than below the lower lip.   Your baby's tongue should be between his or her lower gum and your breast.   Ensure that your baby's mouth is correctly positioned around your nipple (latched). Your baby's lips should create a seal on your breast and be turned out (everted).  It is common for your baby to suck about 2 3 minutes in order to start the flow of breast milk. Latching Teaching your baby how to latch on to your breast properly is very important. An improper latch can cause nipple pain and decreased milk supply for you and poor weight gain in your baby. Also, if your baby is not latched onto your nipple properly, he or she may swallow some air during feeding. This can make your baby fussy. Burping your baby when you switch breasts during the feeding can help to get rid of the air. However, teaching your baby to latch on properly is still the best way to prevent fussiness from swallowing air while breastfeeding. Signs that your baby has successfully latched on to your nipple:    Silent tugging or silent sucking, without causing you pain.   Swallowing heard between every 3 4 sucks.    Muscle movement above and in front of his or her ears while sucking.  Signs that your baby has not successfully latched on to nipple:   Sucking sounds or smacking sounds from your baby while breastfeeding.  Nipple pain. If you think your baby has not latched on correctly, slip your finger into the corner of your baby's mouth to break the suction and place it between your baby's gums. Attempt breastfeeding initiation again. Signs of Successful Breastfeeding Signs from your  baby:   A gradual decrease in the number of sucks or complete cessation of sucking.   Falling asleep.   Relaxation of his or her body.   Retention of a small amount of milk in his or her mouth.   Letting go of your breast by himself or herself. Signs from you:  Breasts that have increased in firmness, weight, and size 1 3 hours after feeding.   Breasts that are softer immediately after breastfeeding.  Increased milk volume, as well as a change in milk consistency and color by the 5th day of breastfeeding.   Nipples that are not sore, cracked, or bleeding. Signs That Your Baby is Getting Enough Milk  Wetting at least 3 diapers in a 24-hour period. The urine should be clear and pale yellow by age 5 days.  At least 3   stools in a 24-hour period by age 5 days. The stool should be soft and yellow.  At least 3 stools in a 24-hour period by age 7 days. The stool should be seedy and yellow.  No loss of weight greater than 10% of birth weight during the first 3 days of age.  Average weight gain of 4 7 ounces (120 210 mL) per week after age 4 days.  Consistent daily weight gain by age 5 days, without weight loss after the age of 2 weeks. After a feeding, your baby may spit up a small amount. This is common. BREASTFEEDING FREQUENCY AND DURATION Frequent feeding will help you make more milk and can prevent sore nipples and breast engorgement. Breastfeed when you feel the need to reduce the fullness of your breasts or when your baby shows signs of hunger. This is called "breastfeeding on demand." Avoid introducing a pacifier to your baby while you are working to establish breastfeeding (the first 4 6 weeks after your baby is born). After this time you may choose to use a pacifier. Research has shown that pacifier use during the first year of a baby's life decreases the risk of sudden infant death syndrome (SIDS). Allow your baby to feed on each breast as long as he or she wants.  Breastfeed until your baby is finished feeding. When your baby unlatches or falls asleep while feeding from the first breast, offer the second breast. Because newborns are often sleepy in the first few weeks of life, you may need to awaken your baby to get him or her to feed. Breastfeeding times will vary from baby to baby. However, the following rules can serve as a guide to help you ensure that your baby is properly fed:  Newborns (babies 4 weeks of age or younger) may breastfeed every 1 3 hours.  Newborns should not go longer than 3 hours during the day or 5 hours during the night without breastfeeding.  You should breastfeed your baby a minimum of 8 times in a 24-hour period until you begin to introduce solid foods to your baby at around 6 months of age. BREAST MILK PUMPING Pumping and storing breast milk allows you to ensure that your baby is exclusively fed your breast milk, even at times when you are unable to breastfeed. This is especially important if you are going back to work while you are still breastfeeding or when you are not able to be present during feedings. Your lactation consultant can give you guidelines on how long it is safe to store breast milk.  A breast pump is a machine that allows you to pump milk from your breast into a sterile bottle. The pumped breast milk can then be stored in a refrigerator or freezer. Some breast pumps are operated by hand, while others use electricity. Ask your lactation consultant which type will work best for you. Breast pumps can be purchased, but some hospitals and breastfeeding support groups lease breast pumps on a monthly basis. A lactation consultant can teach you how to hand express breast milk, if you prefer not to use a pump.  CARING FOR YOUR BREASTS WHILE YOU BREASTFEED Nipples can become dry, cracked, and sore while breastfeeding. The following recommendations can help keep your breasts moisturized and healthy:  Avoid using soap on your  nipples.   Wear a supportive bra. Although not required, special nursing bras and tank tops are designed to allow access to your breasts for breastfeeding without taking off your entire   bra or top. Avoid wearing underwire style bras or extremely tight bras.  Air dry your nipples for 3 4minutes after each feeding.   Use only cotton bra pads to absorb leaked breast milk. Leaking of breast milk between feedings is normal.   Use lanolin on your nipples after breastfeeding. Lanolin helps to maintain your skin's normal moisture barrier. If you use pure lanolin you do not need to wash it off before feeding your baby again. Pure lanolin is not toxic to your baby. You may also hand express a few drops of breast milk and gently massage that milk into your nipples and allow the milk to air dry. In the first few weeks after giving birth, some women experience extremely full breasts (engorgement). Engorgement can make your breasts feel heavy, warm, and tender to the touch. Engorgement peaks within 3 5 days after you give birth. The following recommendations can help ease engorgement:  Completely empty your breasts while breastfeeding or pumping. You may want to start by applying warm, moist heat (in the shower or with warm water-soaked hand towels) just before feeding or pumping. This increases circulation and helps the milk flow. If your baby does not completely empty your breasts while breastfeeding, pump any extra milk after he or she is finished.  Wear a snug bra (nursing or regular) or tank top for 1 2 days to signal your body to slightly decrease milk production.  Apply ice packs to your breasts, unless this is too uncomfortable for you.  Make sure that your baby is latched on and positioned properly while breastfeeding. If engorgement persists after 48 hours of following these recommendations, contact your health care provider or a lactation consultant. OVERALL HEALTH CARE RECOMMENDATIONS WHILE  BREASTFEEDING  Eat healthy foods. Alternate between meals and snacks, eating 3 of each per day. Because what you eat affects your breast milk, some of the foods may make your baby more irritable than usual. Avoid eating these foods if you are sure that they are negatively affecting your baby.  Drink milk, fruit juice, and water to satisfy your thirst (about 10 glasses a day).   Rest often, relax, and continue to take your prenatal vitamins to prevent fatigue, stress, and anemia.  Continue breast self-awareness checks.  Avoid chewing and smoking tobacco.  Avoid alcohol and drug use. Some medicines that may be harmful to your baby can pass through breast milk. It is important to ask your health care provider before taking any medicine, including all over-the-counter and prescription medicine as well as vitamin and herbal supplements. It is possible to become pregnant while breastfeeding. If birth control is desired, ask your health care provider about options that will be safe for your baby. SEEK MEDICAL CARE IF:   You feel like you want to stop breastfeeding or have become frustrated with breastfeeding.  You have painful breasts or nipples.  Your nipples are cracked or bleeding.  Your breasts are red, tender, or warm.  You have a swollen area on either breast.  You have a fever or chills.  You have nausea or vomiting.  You have drainage other than breast milk from your nipples.  Your breasts do not become full before feedings by the 5th day after you give birth.  You feel sad and depressed.  Your baby is too sleepy to eat well.  Your baby is having trouble sleeping.   Your baby is wetting less than 3 diapers in a 24-hour period.  Your baby has less than   3 stools in a 24-hour period.  Your baby's skin or the white part of his or her eyes becomes yellow.   Your baby is not gaining weight by 5 days of age. SEEK IMMEDIATE MEDICAL CARE IF:   Your baby is overly tired  (lethargic) and does not want to wake up and feed.  Your baby develops an unexplained fever. Document Released: 02/04/2005 Document Revised: 10/07/2012 Document Reviewed: 07/29/2012 [employer] Patient Information 2014 [employer].  

## 2013-05-20 NOTE — Progress Notes (Signed)
P= 88 1hr gtt and 28 week labs today.

## 2013-05-20 NOTE — Progress Notes (Signed)
1+ protein and mod. leuks--check cultures 28 wk labs today U/s growth given GHTN BP ok today and labs are WNL.

## 2013-05-20 NOTE — Progress Notes (Signed)
OB U/S scheduled for 05/26/13 at 4:15 pm.

## 2013-05-21 ENCOUNTER — Encounter: Payer: Self-pay | Admitting: Family Medicine

## 2013-05-21 LAB — RPR

## 2013-05-21 LAB — GLUCOSE TOLERANCE, 1 HOUR (50G) W/O FASTING: GLUCOSE 1 HOUR GTT: 128 mg/dL (ref 70–140)

## 2013-05-21 LAB — HIV ANTIBODY (ROUTINE TESTING W REFLEX): HIV: NONREACTIVE

## 2013-05-23 LAB — CULTURE, OB URINE

## 2013-05-24 ENCOUNTER — Telehealth: Payer: Self-pay | Admitting: *Deleted

## 2013-05-24 MED ORDER — CEPHALEXIN 500 MG PO CAPS: 500.0000 mg | ORAL_CAPSULE | Freq: Four times a day (QID) | ORAL | Status: AC

## 2013-05-24 NOTE — Telephone Encounter (Addendum)
Message copied by Jill SideAY, Cem Kosman L on Mon May 24, 2013  2:39 PM ------      Message from: Reva BoresPRATT, TANYA S      Created: Sat May 22, 2013  1:26 PM       Has UTI--needs Keflex 500mg  qid called in x 7 days # 28 no RF  ------  Called pt and left message stating that I am calling with result information.  Please call back and state whether we may leave the information on your voice mail.  Rx has been e-prescribed. Camylle Whicker RNC         Pt returned my call at 1520 and I informed her of +UTI requiring antibiotic treatment. Her prescription has been sent to her pharmacy.  Pt voiced understanding. Nesha Counihan RNC

## 2013-05-26 ENCOUNTER — Ambulatory Visit (HOSPITAL_COMMUNITY)
Admission: RE | Admit: 2013-05-26 | Discharge: 2013-05-26 | Disposition: A | Discharge status: Home / Self Care | Payer: Medicaid Other | Source: Ambulatory Visit | Attending: Family Medicine | Admitting: Family Medicine

## 2013-05-26 DIAGNOSIS — O358XX0 Maternal care for other (suspected) fetal abnormality and damage, not applicable or unspecified: Secondary | ICD-10-CM | POA: Insufficient documentation

## 2013-05-26 DIAGNOSIS — O139 Gestational [pregnancy-induced] hypertension without significant proteinuria, unspecified trimester: Secondary | ICD-10-CM | POA: Insufficient documentation

## 2013-05-26 DIAGNOSIS — O133 Gestational [pregnancy-induced] hypertension without significant proteinuria, third trimester: Secondary | ICD-10-CM

## 2013-05-26 DIAGNOSIS — O162 Unspecified maternal hypertension, second trimester: Secondary | ICD-10-CM

## 2013-06-02 ENCOUNTER — Encounter: Payer: Medicaid Other | Admitting: Family Medicine

## 2013-06-02 ENCOUNTER — Ambulatory Visit (INDEPENDENT_AMBULATORY_CARE_PROVIDER_SITE_OTHER): Payer: Medicaid Other | Admitting: Obstetrics & Gynecology

## 2013-06-02 ENCOUNTER — Encounter: Payer: Self-pay | Admitting: Advanced Practice Midwife

## 2013-06-02 VITALS — BP 112/74 | Temp 97.7°F | Wt 265.6 lb

## 2013-06-02 DIAGNOSIS — O133 Gestational [pregnancy-induced] hypertension without significant proteinuria, third trimester: Secondary | ICD-10-CM

## 2013-06-02 DIAGNOSIS — O139 Gestational [pregnancy-induced] hypertension without significant proteinuria, unspecified trimester: Secondary | ICD-10-CM

## 2013-06-02 NOTE — Patient Instructions (Addendum)
Third Trimester of Pregnancy The third trimester is from week 29 through week 42, months 7 through 9. The third trimester is a time when the fetus is growing rapidly. At the end of the ninth month, the fetus is about 20 inches in length and weighs 6 10 pounds.  BODY CHANGES Your body goes through many changes during pregnancy. The changes vary from woman to woman.   Your weight will continue to increase. You can expect to gain 25 35 pounds (11 16 kg) by the end of the pregnancy.  You may begin to get stretch marks on your hips, abdomen, and breasts.  You may urinate more often because the fetus is moving lower into your pelvis and pressing on your bladder.  You may develop or continue to have heartburn as a result of your pregnancy.  You may develop constipation because certain hormones are causing the muscles that push waste through your intestines to slow down.  You may develop hemorrhoids or swollen, bulging veins (varicose veins).  You may have pelvic pain because of the weight gain and pregnancy hormones relaxing your joints between the bones in your pelvis. Back aches may result from over exertion of the muscles supporting your posture.  Your breasts will continue to grow and be tender. A yellow discharge may leak from your breasts called colostrum.  Your belly button may stick out.  You may feel short of breath because of your expanding uterus.  You may notice the fetus "dropping," or moving lower in your abdomen.  You may have a bloody mucus discharge. This usually occurs a few days to a week before labor begins.  Your cervix becomes thin and soft (effaced) near your due date. WHAT TO EXPECT AT YOUR PRENATAL EXAMS  You will have prenatal exams every 2 weeks until week 36. Then, you will have weekly prenatal exams. During a routine prenatal visit:  You will be weighed to make sure you and the fetus are growing normally.  Your blood pressure is taken.  Your abdomen will be  measured to track your baby's growth.  The fetal heartbeat will be listened to.  Any test results from the previous visit will be discussed.  You may have a cervical check near your due date to see if you have effaced. At around 36 weeks, your caregiver will check your cervix. At the same time, your caregiver will also perform a test on the secretions of the vaginal tissue. This test is to determine if a type of bacteria, Group B streptococcus, is present. Your caregiver will explain this further. Your caregiver may ask you:  What your birth plan is.  How you are feeling.  If you are feeling the baby move.  If you have had any abnormal symptoms, such as leaking fluid, bleeding, severe headaches, or abdominal cramping.  If you have any questions. Other tests or screenings that may be performed during your third trimester include:  Blood tests that check for low iron levels (anemia).  Fetal testing to check the health, activity level, and growth of the fetus. Testing is done if you have certain medical conditions or if there are problems during the pregnancy. FALSE LABOR You may feel small, irregular contractions that eventually go away. These are called Braxton Hicks contractions, or false labor. Contractions may last for hours, days, or even weeks before true labor sets in. If contractions come at regular intervals, intensify, or become painful, it is best to be seen by your caregiver.  SIGNS OF LABOR   Menstrual-like cramps.  Contractions that are 5 minutes apart or less.  Contractions that start on the top of the uterus and spread down to the lower abdomen and back.  A sense of increased pelvic pressure or back pain.  A watery or bloody mucus discharge that comes from the vagina. If you have any of these signs before the 37th week of pregnancy, call your caregiver right away. You need to go to the hospital to get checked immediately. HOME CARE INSTRUCTIONS   Avoid all  smoking, herbs, alcohol, and unprescribed drugs. These chemicals affect the formation and growth of the baby.  Follow your caregiver's instructions regarding medicine use. There are medicines that are either safe or unsafe to take during pregnancy.  Exercise only as directed by your caregiver. Experiencing uterine cramps is a good sign to stop exercising.  Continue to eat regular, healthy meals.  Wear a good support bra for breast tenderness.  Do not use hot tubs, steam rooms, or saunas.  Wear your seat belt at all times when driving.  Avoid raw meat, uncooked cheese, cat litter boxes, and soil used by cats. These carry germs that can cause birth defects in the baby.  Take your prenatal vitamins.  Try taking a stool softener (if your caregiver approves) if you develop constipation. Eat more high-fiber foods, such as fresh vegetables or fruit and whole grains. Drink plenty of fluids to keep your urine clear or pale yellow.  Take warm sitz baths to soothe any pain or discomfort caused by hemorrhoids. Use hemorrhoid cream if your caregiver approves.  If you develop varicose veins, wear support hose. Elevate your feet for 15 minutes, 3 4 times a day. Limit salt in your diet.  Avoid heavy lifting, wear low heal shoes, and practice good posture.  Rest a lot with your legs elevated if you have leg cramps or low back pain.  Visit your dentist if you have not gone during your pregnancy. Use a soft toothbrush to brush your teeth and be gentle when you floss.  A sexual relationship may be continued unless your caregiver directs you otherwise.  Do not travel far distances unless it is absolutely necessary and only with the approval of your caregiver.  Take prenatal classes to understand, practice, and ask questions about the labor and delivery.  Make a trial run to the hospital.  Pack your hospital bag.  Prepare the baby's nursery.  Continue to go to all your prenatal visits as directed  by your caregiver. SEEK MEDICAL CARE IF:  You are unsure if you are in labor or if your water has broken.  You have dizziness.  You have mild pelvic cramps, pelvic pressure, or nagging pain in your abdominal area.  You have persistent nausea, vomiting, or diarrhea.  You have a bad smelling vaginal discharge.  You have pain with urination. SEEK IMMEDIATE MEDICAL CARE IF:   You have a fever.  You are leaking fluid from your vagina.  You have spotting or bleeding from your vagina.  You have severe abdominal cramping or pain.  You have rapid weight loss or gain.  You have shortness of breath with chest pain.  You notice sudden or extreme swelling of your face, hands, ankles, feet, or legs.  You have not felt your baby move in over an hour.  You have severe headaches that do not go away with medicine.  You have vision changes. Document Released: 01/29/2001 Document Revised: 10/07/2012 Document Reviewed:   04/07/2012 ExitCare Patient Information 2014 [employer] [place]. Levonorgestrel intrauterine device (IUD) What is this medicine? LEVONORGESTREL IUD (LEE voe nor jes trel) is a contraceptive (birth control) device. The device is placed inside the uterus by a healthcare professional. It is used to prevent pregnancy and can also be used to treat heavy bleeding that occurs during your period. Depending on the device, it can be used for 3 to 5 years. This medicine may be used for other purposes; ask your health care provider or pharmacist if you have questions. COMMON BRAND NAME(S): Gretta CoolMirena, Skyla What should I tell my health care provider before I take this medicine? They need to know if you have any of these conditions: -abnormal Pap smear -cancer of the breast, uterus, or cervix -diabetes -endometritis -genital or pelvic infection now or in the past -have more than one sexual partner or your partner has more than one partner -heart disease -history of an ectopic or tubal  pregnancy -immune system problems -IUD in place -liver disease or tumor -problems with blood clots or take blood-thinners -use intravenous drugs -uterus of unusual shape -vaginal bleeding that has not been explained -an unusual or allergic reaction to levonorgestrel, other hormones, silicone, or polyethylene, medicines, foods, dyes, or preservatives -pregnant or trying to get pregnant -breast-feeding How should I use this medicine? This device is placed inside the uterus by a health care professional. Talk to your pediatrician regarding the use of this medicine in children. Special care may be needed. Overdosage: If you think you have taken too much of this medicine contact a poison control center or emergency room at once. NOTE: This medicine is only for you. Do not share this medicine with others. What if I miss a dose? This does not apply. What may interact with this medicine? Do not take this medicine with any of the following medications: -amprenavir -bosentan -fosamprenavir This medicine may also interact with the following medications: -aprepitant -barbiturate medicines for inducing sleep or treating seizures -bexarotene -griseofulvin -medicines to treat seizures like carbamazepine, ethotoin, felbamate, oxcarbazepine, phenytoin, topiramate -modafinil -pioglitazone -rifabutin -rifampin -rifapentine -some medicines to treat HIV infection like atazanavir, indinavir, lopinavir, nelfinavir, tipranavir, ritonavir -St. John's wort -warfarin This list may not describe all possible interactions. Give your health care provider a list of all the medicines, herbs, non-prescription drugs, or dietary supplements you use. Also tell them if you smoke, drink alcohol, or use illegal drugs. Some items may interact with your medicine. What should I watch for while using this medicine? Visit your doctor or health care professional for regular check ups. See your doctor if you or your partner  has sexual contact with others, becomes HIV positive, or gets a sexual transmitted disease. This product does not protect you against HIV infection (AIDS) or other sexually transmitted diseases. You can check the placement of the IUD yourself by reaching up to the top of your vagina with clean fingers to feel the threads. Do not pull on the threads. It is a good habit to check placement after each menstrual period. Call your doctor right away if you feel more of the IUD than just the threads or if you cannot feel the threads at all. The IUD may come out by itself. You may become pregnant if the device comes out. If you notice that the IUD has come out use a backup birth control method like condoms and call your health care provider. Using tampons will not change the position of the IUD and are okay to use  during your period. What side effects may I notice from receiving this medicine? Side effects that you should report to your doctor or health care professional as soon as possible: -allergic reactions like skin rash, itching or hives, swelling of the face, lips, or tongue -fever, flu-like symptoms -genital sores -high blood pressure -no menstrual period for 6 weeks during use -pain, swelling, warmth in the leg -pelvic pain or tenderness -severe or sudden headache -signs of pregnancy -stomach cramping -sudden shortness of breath -trouble with balance, talking, or walking -unusual vaginal bleeding, discharge -yellowing of the eyes or skin Side effects that usually do not require medical attention (report to your doctor or health care professional if they continue or are bothersome): -acne -breast pain -change in sex drive or performance -changes in weight -cramping, dizziness, or faintness while the device is being inserted -headache -irregular menstrual bleeding within first 3 to 6 months of use -nausea This list may not describe all possible side effects. Call your doctor for medical  advice about side effects. You may report side effects to FDA at 1-800-FDA-1088. Where should I keep my medicine? This does not apply. NOTE: This sheet is a summary. It may not cover all possible information. If you have questions about this medicine, talk to your doctor, pharmacist, or health care provider.  2014, [employer]. (2011-03-07 13:54:04)

## 2013-06-02 NOTE — Progress Notes (Signed)
Pt c/o lower abd pressure. She denies ctx or LOF For f/u sono in 4-6 weeks Bp good today

## 2013-06-02 NOTE — Progress Notes (Signed)
p=96 

## 2013-06-15 ENCOUNTER — Inpatient Hospital Stay (HOSPITAL_COMMUNITY): Payer: Medicaid Other

## 2013-06-15 ENCOUNTER — Encounter (HOSPITAL_COMMUNITY): Payer: Self-pay | Admitting: *Deleted

## 2013-06-15 ENCOUNTER — Inpatient Hospital Stay (HOSPITAL_COMMUNITY)
Admission: AD | Admit: 2013-06-15 | Discharge: 2013-06-16 | DRG: 782 | Disposition: A | Discharge status: Home / Self Care | Payer: Medicaid Other | Source: Ambulatory Visit | Attending: Obstetrics & Gynecology | Admitting: Obstetrics & Gynecology

## 2013-06-15 DIAGNOSIS — IMO0002 Reserved for concepts with insufficient information to code with codable children: Secondary | ICD-10-CM | POA: Diagnosis present

## 2013-06-15 DIAGNOSIS — R109 Unspecified abdominal pain: Secondary | ICD-10-CM | POA: Diagnosis present

## 2013-06-15 DIAGNOSIS — O368190 Decreased fetal movements, unspecified trimester, not applicable or unspecified: Principal | ICD-10-CM | POA: Diagnosis present

## 2013-06-15 DIAGNOSIS — O139 Gestational [pregnancy-induced] hypertension without significant proteinuria, unspecified trimester: Secondary | ICD-10-CM

## 2013-06-15 LAB — URINE MICROSCOPIC-ADD ON

## 2013-06-15 LAB — FETAL FIBRONECTIN: FETAL FIBRONECTIN: NEGATIVE

## 2013-06-15 LAB — URINALYSIS, ROUTINE W REFLEX MICROSCOPIC
BILIRUBIN URINE: NEGATIVE
Glucose, UA: NEGATIVE mg/dL
HGB URINE DIPSTICK: NEGATIVE
KETONES UR: NEGATIVE mg/dL
NITRITE: NEGATIVE
PROTEIN: NEGATIVE mg/dL
Specific Gravity, Urine: 1.025 (ref 1.005–1.030)
Urobilinogen, UA: 4 mg/dL — ABNORMAL HIGH (ref 0.0–1.0)
pH: 6.5 (ref 5.0–8.0)

## 2013-06-15 LAB — WET PREP, GENITAL
Clue Cells Wet Prep HPF POC: NONE SEEN
Trich, Wet Prep: NONE SEEN
Yeast Wet Prep HPF POC: NONE SEEN

## 2013-06-15 MED ORDER — DOCUSATE SODIUM 100 MG PO CAPS: 100.0000 mg | ORAL_CAPSULE | Freq: Every day | ORAL | Status: DC

## 2013-06-15 MED ORDER — PRENATAL MULTIVITAMIN CH: 1.0000 | ORAL_TABLET | Freq: Every day | ORAL | Status: DC

## 2013-06-15 MED ORDER — ACETAMINOPHEN 325 MG PO TABS: 650.0000 mg | ORAL_TABLET | ORAL | Status: DC | PRN

## 2013-06-15 MED ORDER — CALCIUM CARBONATE ANTACID 500 MG PO CHEW: 2.0000 | CHEWABLE_TABLET | ORAL | Status: DC | PRN

## 2013-06-15 MED ORDER — ZOLPIDEM TARTRATE 5 MG PO TABS: 5.0000 mg | ORAL_TABLET | Freq: Every evening | ORAL | Status: DC | PRN

## 2013-06-15 NOTE — H&P (Signed)
Karen Christensen is a 23 y.o. female G1P0 , pt of [facility], presenting for Decreased Fetal Movement and Abdominal Pain Pt states that she has not felt the fetus move for 2 days. Prior to this infant was very active. Has felt no movement in that time. Pt also complains of sharp pain in her vagina approximately once an hour. Lasting 5-15 min. No exacerbating factors. Helps if on the ball. No discharge.  Denies LOF, no VB. Nothing in vagina in last 24 hrs.  Of note, pt reports she last at ~10 hours ago.  She has been drinking some fluids throughout the afternoon before coming to MAU.  . Maternal Medical History:  Reason for admission: Nausea. nonreactive NST, 6/8 BPP, deceleration in MAU  Contractions: Frequency: rare.   Perceived severity is mild.    Fetal activity: Perceived fetal activity is decreased.   Last perceived fetal movement was within the past hour.    Prenatal complications: PIH.   Gestational HTN without proteinuria  Prenatal Complications - Diabetes: none.    OB History   Grav Para Term Preterm Abortions TAB SAB Ect Mult Living   1              Past Medical History  Diagnosis Date  . Medical history non-contributory    Past Surgical History  Procedure Laterality Date  . No past surgeries     Family History: family history includes Asthma in her sister; Cancer in her maternal grandmother and paternal grandmother. There is no history of Stroke or Heart disease. Social History:  reports that she has never smoked. She has never used smokeless tobacco. She reports that she does not drink alcohol or use illicit drugs.   Prenatal Transfer Tool  Maternal Diabetes: No Genetic Screening: Normal Maternal Ultrasounds/Referrals: Normal Fetal Ultrasounds or other Referrals:  None Maternal Substance Abuse:  No Significant Maternal Medications:  None Significant Maternal Lab Results:  Lab values include: Other:  Other Comments:  GBS unknown  Review of Systems  Constitutional:  Negative for fever, chills and malaise/fatigue.  Eyes: Negative for blurred vision.  Respiratory: Negative for cough and shortness of breath.   Cardiovascular: Negative for chest pain.  Gastrointestinal: Negative for heartburn, nausea and vomiting.  Genitourinary: Negative for dysuria, urgency and frequency.  Musculoskeletal: Negative.   Neurological: Negative for dizziness and headaches.  Psychiatric/Behavioral: Negative for depression.    Dilation: Closed Effacement (%): Thick Exam by:: Dr. Ike Benedom Blood pressure 106/51, pulse 86, temperature 98.2 F (36.8 C), temperature source Oral, resp. rate 18, height 5\' 6"  (1.676 m), weight 120.203 kg (265 lb), last menstrual period 11/11/2012, SpO2 100.00%. Maternal Exam:  Uterine Assessment: Contraction frequency is rare.   Abdomen: Patient reports no abdominal tenderness.   Fetal Exam Fetal Monitor Review: Mode: ultrasound.   Baseline rate: 145.  Variability: moderate (6-25 bpm).   Pattern: accelerations present and prolonged decelerations.    Fetal State Assessment: Category II - tracings are indeterminate.     Physical Exam  Nursing note and vitals reviewed. Constitutional: She is oriented to person, place, and time. She appears well-developed and well-nourished.  Neck: Normal range of motion.  Cardiovascular: Normal rate.   Respiratory: Effort normal and breath sounds normal.  GI: Soft. Bowel sounds are normal.  Musculoskeletal: Normal range of motion.  Neurological: She is alert and oriented to person, place, and time. She has normal reflexes.  Skin: Skin is warm and dry.  Psychiatric: She has a normal mood and affect. Her behavior is normal.  Judgment and thought content normal.    Prenatal labs: ABO, Rh: B/POS/-- (11/25 1059) Antibody: NEG (11/25 1059) Rubella: 3.25 (11/25 1059) RPR: NON REAC (04/02 1017)  HBsAg: NEGATIVE (11/25 1059)  HIV: NON REACTIVE (04/02 1017)  GBS:   Unknown  Assessment/Plan: Admit to  antepartum for extended monitoring Repeat BPP in am Regular diet Encourage PO fluids  Misty StanleyLisa A Leftwich-Kirby 06/15/2013, 9:46 PM

## 2013-06-15 NOTE — MAU Note (Signed)
Patient presents to MAU with c/o sharp lower abdominal pain and decreased movement for the past 2 days. Denies LOF, VB, or contractions at this time.

## 2013-06-15 NOTE — MAU Note (Signed)
EFM reapplied after patient returned from bathroom. FHR 140's. RN palpated movement upon EFM placement. Patient verbalized feeling fetal movement.

## 2013-06-15 NOTE — MAU Provider Note (Signed)
None     Chief Complaint:  Decreased Fetal Movement and Abdominal Pain   Karen Christensen is  23 y.o. G1P0 at 7326w6d presents complaining of Decreased Fetal Movement and Abdominal Pain  Pt states that she has not felt the fetus move for 2 days. Prior to this infant was very active. Has felt no movement in that time.  Pt also complains of sharp pain in her vagina approximately once an hour. Lasting 5-15 min. No exacerbating factors. Helps if on the ball. No discharge.   Denies LOF, no VB. Nothing in vagina in last 24 hrs.   Obstetrical/Gynecological History: OB History   Grav Para Term Preterm Abortions TAB SAB Ect Mult Living   1              Past Medical History: Past Medical History  Diagnosis Date  . Medical history non-contributory     Past Surgical History: Past Surgical History  Procedure Laterality Date  . No past surgeries      Family History: Family History  Problem Relation Age of Onset  . Asthma Sister   . Cancer Maternal Grandmother   . Cancer Paternal Grandmother   . Stroke Neg Hx   . Heart disease Neg Hx     Social History: History  Substance Use Topics  . Smoking status: Never Smoker   . Smokeless tobacco: Never Used  . Alcohol Use: No    Allergies: No Known Allergies  Meds:  Facility-administered medications prior to admission  Medication Dose Route Frequency Provider Last Rate Last Dose  . Tdap (BOOSTRIX) injection 0.5 mL  0.5 mL Intramuscular Once Reva Boresanya S Pratt, MD       Prescriptions prior to admission  Medication Sig Dispense Refill  . acetaminophen (TYLENOL) 325 MG tablet Take 650 mg by mouth every 6 (six) hours as needed for moderate pain or headache. For fever/pain      . Prenatal Vit-Fe Fumarate-FA (PRENATAL MULTIVITAMIN) TABS tablet Take 1 tablet by mouth daily at 12 noon.        Review of Systems -   Review of Systems  No HA, no vision changes, no itching, no CP, no SOB, no urinary symptoms. No bowel complaints. No other  complaints. +Emesis this AM 2x tolerating PO after.  Physical Exam  Blood pressure 106/51, pulse 86, temperature 98.2 F (36.8 C), temperature source Oral, resp. rate 18, height 5\' 6"  (1.676 m), weight 120.203 kg (265 lb), last menstrual period 11/11/2012, SpO2 100.00%. GENERAL: Well-developed, well-nourished female in no acute distress.  LUNGS: Clear to auscultation bilaterally.  HEART: Regular rate and rhythm. ABDOMEN: Soft, nontender, nondistended, gravid.  EXTREMITIES: Nontender, no edema, 2+ distal pulses.  Dilation: Closed Effacement (%): Thick Cervical Position: Posterior Exam by:: Dr. Ike Benedom  Presentation: cephalic FHT:  Baseline rate 140s bpm   Variability moderate  Accelerations absent   Decelerations none Contractions: none  Limited Third trimester US: SIUP, VTX, Post Plc, FHT 143, AFI 14.6  Labs: Results for orders placed during the hospital encounter of 06/15/13 (from the past 24 hour(s))  URINALYSIS, ROUTINE W REFLEX MICROSCOPIC   Collection Time    06/15/13  6:20 PM      Result Value Ref Range   Color, Urine YELLOW  YELLOW   APPearance HAZY (*) CLEAR   Specific Gravity, Urine 1.025  1.005 - 1.030   pH 6.5  5.0 - 8.0   Glucose, UA NEGATIVE  NEGATIVE mg/dL   Hgb urine dipstick NEGATIVE  NEGATIVE   Bilirubin  Urine NEGATIVE  NEGATIVE   Ketones, ur NEGATIVE  NEGATIVE mg/dL   Protein, ur NEGATIVE  NEGATIVE mg/dL   Urobilinogen, UA 4.0 (*) 0.0 - 1.0 mg/dL   Nitrite NEGATIVE  NEGATIVE   Leukocytes, UA SMALL (*) NEGATIVE  URINE MICROSCOPIC-ADD ON   Collection Time    06/15/13  6:20 PM      Result Value Ref Range   Squamous Epithelial / LPF MANY (*) RARE   WBC, UA 7-10  <3 WBC/hpf   RBC / HPF 3-6  <3 RBC/hpf   Bacteria, UA MANY (*) RARE   Urine-Other MUCOUS PRESENT    WET PREP, GENITAL   Collection Time    06/15/13  6:38 PM      Result Value Ref Range   Yeast Wet Prep HPF POC NONE SEEN  NONE SEEN   Trich, Wet Prep NONE SEEN  NONE SEEN   Clue Cells Wet Prep  HPF POC NONE SEEN  NONE SEEN   WBC, Wet Prep HPF POC MANY (*) NONE SEEN  FETAL FIBRONECTIN   Collection Time    06/15/13  6:38 PM      Result Value Ref Range   Fetal Fibronectin NEGATIVE  NEGATIVE   Imaging Studies:  [place] [facility] Follow Up  05/26/2013   OBSTETRICAL ULTRASOUND: This exam was performed within a  Ultrasound Department. The OB US report was generated in the [facility] system, and faxed to the ordering physician.   This report is also available in [facility] [employer] and in the [facility] PACS. See [facility] Obstetric US report.   Assessment: Karen Christensen is  23 y.o. G1P0 at 5360w6d who presents with decreased fetal movement. BPP 6/8. Continued fetal tracing showed a prolonged deceleration.  Plan: 1. Admit to antenatal for continued observation and fetal monitoring.   2. Repeat BPP in the morning.   Ignacia Fellingobyn Restrepo, MD   I have seen this patient and agree with the above resident's note.  LEFTWICH-KIRBY, Nazli Penn Certified Nurse-Midwife

## 2013-06-16 ENCOUNTER — Encounter: Payer: Medicaid Other | Admitting: Advanced Practice Midwife

## 2013-06-16 ENCOUNTER — Inpatient Hospital Stay (HOSPITAL_COMMUNITY): Payer: Medicaid Other

## 2013-06-16 DIAGNOSIS — O368190 Decreased fetal movements, unspecified trimester, not applicable or unspecified: Principal | ICD-10-CM

## 2013-06-16 DIAGNOSIS — R109 Unspecified abdominal pain: Secondary | ICD-10-CM

## 2013-06-16 DIAGNOSIS — O139 Gestational [pregnancy-induced] hypertension without significant proteinuria, unspecified trimester: Secondary | ICD-10-CM

## 2013-06-16 LAB — TYPE AND SCREEN
ABO/RH(D): B POS
ANTIBODY SCREEN: NEGATIVE

## 2013-06-16 LAB — CBC
HCT: 29.9 % — ABNORMAL LOW (ref 36.0–46.0)
HEMOGLOBIN: 9.7 g/dL — AB (ref 12.0–15.0)
MCH: 26.6 pg (ref 26.0–34.0)
MCHC: 32.4 g/dL (ref 30.0–36.0)
MCV: 82.1 fL (ref 78.0–100.0)
Platelets: 293 10*3/uL (ref 150–400)
RBC: 3.64 MIL/uL — ABNORMAL LOW (ref 3.87–5.11)
RDW: 14.4 % (ref 11.5–15.5)
WBC: 7.3 10*3/uL (ref 4.0–10.5)

## 2013-06-16 LAB — ABO/RH: ABO/RH(D): B POS

## 2013-06-16 LAB — GC/CHLAMYDIA PROBE AMP
CT Probe RNA: NEGATIVE
GC Probe RNA: NEGATIVE

## 2013-06-16 NOTE — Progress Notes (Signed)
Monitors off for transport to US 

## 2013-06-16 NOTE — Discharge Instructions (Signed)
Fetal Movement Counts Patient Name: __________________________________________________ Patient Due Date: ____________________ Performing a fetal movement count is highly recommended in high-risk pregnancies, but it is good for every pregnant woman to do. Your caregiver may ask you to start counting fetal movements at 28 weeks of the pregnancy. Fetal movements often increase:  After eating a full meal.  After physical activity.  After eating or drinking something sweet or cold.  At rest. Pay attention to when you feel the baby is most active. This will help you notice a pattern of your baby's sleep and wake cycles and what factors contribute to an increase in fetal movement. It is important to perform a fetal movement count at the same time each day when your baby is normally most active.  HOW TO COUNT FETAL MOVEMENTS 1. Find a quiet and comfortable area to sit or lie down on your left side. Lying on your left side provides the best blood and oxygen circulation to your baby. 2. Write down the day and time on a sheet of paper or in a journal. 3. Start counting kicks, flutters, swishes, rolls, or jabs in a 2 hour period. You should feel at least 10 movements within 2 hours. 4. If you do not feel 10 movements in 2 hours, wait 2 3 hours and count again. Look for a change in the pattern or not enough counts in 2 hours. SEEK MEDICAL CARE IF:  You feel less than 10 counts in 2 hours, tried twice.  There is no movement in over an hour.  The pattern is changing or taking longer each day to reach 10 counts in 2 hours.  You feel the baby is not moving as he or she usually does. Date: ____________ Movements: ____________ Start time: ____________ Finish time: ____________  Date: ____________ Movements: ____________ Start time: ____________ Finish time: ____________ Date: ____________ Movements: ____________ Start time: ____________ Finish time: ____________ Date: ____________ Movements: ____________  Start time: ____________ Finish time: ____________ Date: ____________ Movements: ____________ Start time: ____________ Finish time: ____________ Date: ____________ Movements: ____________ Start time: ____________ Finish time: ____________ Date: ____________ Movements: ____________ Start time: ____________ Finish time: ____________ Date: ____________ Movements: ____________ Start time: ____________ Finish time: ____________  Date: ____________ Movements: ____________ Start time: ____________ Finish time: ____________ Date: ____________ Movements: ____________ Start time: ____________ Finish time: ____________ Date: ____________ Movements: ____________ Start time: ____________ Finish time: ____________ Date: ____________ Movements: ____________ Start time: ____________ Finish time: ____________ Date: ____________ Movements: ____________ Start time: ____________ Finish time: ____________ Date: ____________ Movements: ____________ Start time: ____________ Finish time: ____________ Date: ____________ Movements: ____________ Start time: ____________ Finish time: ____________  Date: ____________ Movements: ____________ Start time: ____________ Finish time: ____________ Date: ____________ Movements: ____________ Start time: ____________ Finish time: ____________ Date: ____________ Movements: ____________ Start time: ____________ Finish time: ____________ Date: ____________ Movements: ____________ Start time: ____________ Finish time: ____________ Date: ____________ Movements: ____________ Start time: ____________ Finish time: ____________ Date: ____________ Movements: ____________ Start time: ____________ Finish time: ____________ Date: ____________ Movements: ____________ Start time: ____________ Finish time: ____________  Date: ____________ Movements: ____________ Start time: ____________ Finish time: ____________ Date: ____________ Movements: ____________ Start time: ____________ Finish time:  ____________ Date: ____________ Movements: ____________ Start time: ____________ Finish time: ____________ Date: ____________ Movements: ____________ Start time: ____________ Finish time: ____________ Date: ____________ Movements: ____________ Start time: ____________ Finish time: ____________ Date: ____________ Movements: ____________ Start time: ____________ Finish time: ____________ Date: ____________ Movements: ____________ Start time: ____________ Finish time: ____________  Date: ____________ Movements: ____________ Start time: ____________ Finish   time: ____________ Date: ____________ Movements: ____________ Start time: ____________ Finish time: ____________ Date: ____________ Movements: ____________ Start time: ____________ Finish time: ____________ Date: ____________ Movements: ____________ Start time: ____________ Finish time: ____________ Date: ____________ Movements: ____________ Start time: ____________ Finish time: ____________ Date: ____________ Movements: ____________ Start time: ____________ Finish time: ____________ Date: ____________ Movements: ____________ Start time: ____________ Finish time: ____________  Date: ____________ Movements: ____________ Start time: ____________ Finish time: ____________ Date: ____________ Movements: ____________ Start time: ____________ Finish time: ____________ Date: ____________ Movements: ____________ Start time: ____________ Finish time: ____________ Date: ____________ Movements: ____________ Start time: ____________ Finish time: ____________ Date: ____________ Movements: ____________ Start time: ____________ Finish time: ____________ Date: ____________ Movements: ____________ Start time: ____________ Finish time: ____________ Date: ____________ Movements: ____________ Start time: ____________ Finish time: ____________  Date: ____________ Movements: ____________ Start time: ____________ Finish time: ____________ Date: ____________ Movements:  ____________ Start time: ____________ Finish time: ____________ Date: ____________ Movements: ____________ Start time: ____________ Finish time: ____________ Date: ____________ Movements: ____________ Start time: ____________ Finish time: ____________ Date: ____________ Movements: ____________ Start time: ____________ Finish time: ____________ Date: ____________ Movements: ____________ Start time: ____________ Finish time: ____________ Date: ____________ Movements: ____________ Start time: ____________ Finish time: ____________  Date: ____________ Movements: ____________ Start time: ____________ Finish time: ____________ Date: ____________ Movements: ____________ Start time: ____________ Finish time: ____________ Date: ____________ Movements: ____________ Start time: ____________ Finish time: ____________ Date: ____________ Movements: ____________ Start time: ____________ Finish time: ____________ Date: ____________ Movements: ____________ Start time: ____________ Finish time: ____________ Date: ____________ Movements: ____________ Start time: ____________ Finish time: ____________ Document Released: 03/06/2006 Document Revised: 01/22/2012 Document Reviewed: 12/02/2011 Exit[employer] Patient Information 2014 [employer].  

## 2013-06-16 NOTE — Discharge Summary (Signed)
Attestation of Attending Supervision of Obstetric Fellow: Evaluation and management procedures were performed by the Obstetric Fellow under my supervision and collaboration.  I have reviewed the Obstetric Fellow's note and chart, and I agree with the management and plan.  Eris Hannan, MD, FACOG Attending Obstetrician & Gynecologist Faculty Practice, [facility]   

## 2013-06-16 NOTE — H&P (Signed)
Attestation of Attending Supervision of Advanced Practitioner (CNM/NP): Evaluation and management procedures were performed by the Advanced Practitioner under my supervision and collaboration.  I have reviewed the Advanced Practitioner's note and chart, and I agree with the management and plan.  Lorren Splawn Harraway-Smith 12:39 AM     

## 2013-06-16 NOTE — Discharge Summary (Signed)
Physician Discharge Summary  Patient ID: Karen Christensen MRN: [id number] DOB/AGE: [birth year] 22 y.o.  Admit date: 06/15/2013 Discharge date: 06/16/2013  Admission Diagnoses: decreased fetal movement, 6/10 BPP  Discharge Diagnoses:  Active Problems:   Fetal heart rate nonreactive   Decreased fetal movement   Discharged Condition: good  Hospital Course: Pt was admitted for observation overnight for 6/10 BPP. Overnight NST improved, and repeat 10/10 BPP this morning. Pt feeling fetal movement. Will discharge home today. Message sent to clinic to reschedule  Consults: None  Significant Diagnostic Studies: radiology: BPP 8/8   Treatments: IV hydration and observiation  Discharge Exam: Blood pressure 123/54, pulse 76, temperature 98.3 F (36.8 C), temperature source Oral, resp. rate 18, height 5\' 5"  (1.651 m), weight 118.071 kg (260 lb 4.8 oz), last menstrual period 11/11/2012, SpO2 100.00%. General appearance: alert, cooperative, appears stated age and no distress Head: Normocephalic, without obvious abnormality, atraumatic GI: soft, non-tender; bowel sounds normal; no masses,  no organomegaly Gravid  FHT: 130s mod var, mult accels 10x10 several 15x15. No decels TOCO: no ctx    Disposition: 01-Home or Self Care   Future Appointments Provider Department Dept Phone   07/07/2013 9:45 AM [facility] [phone]       Medication List         acetaminophen 325 MG tablet  Commonly known as:  TYLENOL  Take 650 mg by mouth every 6 (six) hours as needed for moderate pain or headache. For fever/pain     prenatal multivitamin Tabs tablet  Take 1 tablet by mouth daily at 12 noon.           Follow-up Information   Schedule an appointment as soon as possible [facility] ([facility]    Specialty:  Obstetrics and Gynecology   Contact information:   [address] [place] [address] [phone]       Signed: Minta BalsamMichael R Damaso Laday 06/16/2013, 10:23 AM

## 2013-06-17 ENCOUNTER — Encounter (HOSPITAL_COMMUNITY): Payer: Self-pay

## 2013-06-17 ENCOUNTER — Inpatient Hospital Stay (HOSPITAL_COMMUNITY)
Admission: AD | Admit: 2013-06-17 | Discharge: 2013-06-17 | Disposition: A | Discharge status: Home / Self Care | Payer: Medicaid Other | Source: Ambulatory Visit | Attending: Obstetrics & Gynecology | Admitting: Obstetrics & Gynecology

## 2013-06-17 DIAGNOSIS — M549 Dorsalgia, unspecified: Secondary | ICD-10-CM

## 2013-06-17 DIAGNOSIS — R109 Unspecified abdominal pain: Secondary | ICD-10-CM | POA: Insufficient documentation

## 2013-06-17 DIAGNOSIS — M545 Low back pain, unspecified: Secondary | ICD-10-CM | POA: Insufficient documentation

## 2013-06-17 DIAGNOSIS — O368190 Decreased fetal movements, unspecified trimester, not applicable or unspecified: Secondary | ICD-10-CM | POA: Insufficient documentation

## 2013-06-17 DIAGNOSIS — O9989 Other specified diseases and conditions complicating pregnancy, childbirth and the puerperium: Principal | ICD-10-CM

## 2013-06-17 DIAGNOSIS — O99891 Other specified diseases and conditions complicating pregnancy: Secondary | ICD-10-CM | POA: Insufficient documentation

## 2013-06-17 LAB — URINE MICROSCOPIC-ADD ON

## 2013-06-17 LAB — URINALYSIS, ROUTINE W REFLEX MICROSCOPIC
BILIRUBIN URINE: NEGATIVE
GLUCOSE, UA: NEGATIVE mg/dL
HGB URINE DIPSTICK: NEGATIVE
KETONES UR: NEGATIVE mg/dL
Nitrite: NEGATIVE
PROTEIN: NEGATIVE mg/dL
Specific Gravity, Urine: 1.015 (ref 1.005–1.030)
Urobilinogen, UA: 1 mg/dL (ref 0.0–1.0)
pH: 7 (ref 5.0–8.0)

## 2013-06-17 MED ORDER — CYCLOBENZAPRINE HCL 10 MG PO TABS
10.0000 mg | ORAL_TABLET | ORAL | Status: AC
  Administered 2013-06-17: 10 mg via ORAL
  Filled 2013-06-17: qty 1

## 2013-06-17 MED ORDER — CYCLOBENZAPRINE HCL 10 MG PO TABS: 10.0000 mg | ORAL_TABLET | Freq: Three times a day (TID) | ORAL | Status: DC | PRN

## 2013-06-17 NOTE — MA[facility] Provider Note (Signed)
Attestation of Attending Supervision of Advanced Practitioner (CNM/NP): Evaluation and management procedures were performed by the Advanced Practitioner under my supervision and collaboration.  I have reviewed the Advanced Practitioner's note and chart, and I agree with the management and plan.  Kallyn Demarcus Harraway-Smith 4:16 PM

## 2013-06-17 NOTE — Discharge Instructions (Signed)
Back Pain in Pregnancy °Back pain during pregnancy is common. It happens in about half of all pregnancies. It is important for you and your baby that you remain active during your pregnancy. If you feel that back pain is not allowing you to remain active or sleep well, it is time to see your caregiver. Back pain may be caused by several factors related to changes during your pregnancy. Fortunately, unless you had trouble with your back before your pregnancy, the pain is likely to get better after you deliver. °Low back pain usually occurs between the fifth and seventh months of pregnancy. It can, however, happen in the first couple months. Factors that increase the risk of back problems include:  °· Previous back problems. °· Injury to your back. °· Having twins or multiple births. °· A chronic cough. °· Stress. °· Job-related repetitive motions. °· Muscle or spinal disease in the back. °· Family history of back problems, ruptured (herniated) discs, or osteoporosis. °· Depression, anxiety, and panic attacks. °CAUSES  °· When you are pregnant, your body produces a hormone called relaxin. This hormone makes the ligaments connecting the low back and pubic bones more flexible. This flexibility allows the baby to be delivered more easily. When your ligaments are loose, your muscles need to work harder to support your back. Soreness in your back can come from tired muscles. Soreness can also come from back tissues that are irritated since they are receiving less support. °· As the baby grows, it puts pressure on the nerves and blood vessels in your pelvis. This can cause back pain. °· As the baby grows and gets heavier during pregnancy, the uterus pushes the stomach muscles forward and changes your center of gravity. This makes your back muscles work harder to maintain good posture. °SYMPTOMS  °Lumbar pain during pregnancy °Lumbar pain during pregnancy usually occurs at or above the waist in the center of the back. There  may be pain and numbness that radiates into your leg or foot. This is similar to low back pain experienced by non-pregnant women. It usually increases with sitting for long periods of time, standing, or repetitive lifting. Tenderness may also be present in the muscles along your upper back. °Posterior pelvic pain during pregnancy °Pain in the back of the pelvis is more common than lumbar pain in pregnancy. It is a deep pain felt in your side at the waistline, or across the tailbone (sacrum), or in both places. You may have pain on one or both sides. This pain can also go into the buttocks and backs of the upper thighs. Pubic and groin pain may also be present. The pain does not quickly resolve with rest, and morning stiffness may also be present. °Pelvic pain during pregnancy can be brought on by most activities. A high level of fitness before and during pregnancy may or may not prevent this problem. Labor pain is usually 1 to 2 minutes apart, lasts for about 1 minute, and involves a bearing down feeling or pressure in your pelvis. However, if you are at term with the pregnancy, constant low back pain can be the beginning of early labor, and you should be aware of this. °DIAGNOSIS  °X-rays of the back should not be done during the first 12 to 14 weeks of the pregnancy and only when absolutely necessary during the rest of the pregnancy. MRIs do not give off radiation and are safe during pregnancy. MRIs also should only be done when absolutely necessary. °HOME CARE INSTRUCTIONS °· Exercise   as directed by your caregiver. Exercise is the most effective way to prevent or manage back pain. If you have a back problem, it is especially important to avoid sports that require sudden body movements. Swimming and walking are great activities.  Do not stand in one place for long periods of time.  Do not wear high heels.  Sit in chairs with good posture. Use a pillow on your lower back if necessary. Make sure your  head rests over your shoulders and is not hanging forward.  Try sleeping on your side, preferably the left side, with a pillow or two between your legs. If you are sore after a night's rest, your bedmay betoo soft.Try placing a board between your mattress and box spring.  Listen to your body when lifting.If you are experiencing pain, ask for help or try bending yourknees more so you can use your leg muscles rather than your back muscles. Squat down when picking up something from the floor. Do not bend over.  Eat a healthy diet. Try to gain weight within your caregiver's recommendations.  Use heat or cold packs 3 to 4 times a day for 15 minutes to help with the pain.  Only take over-the-counter or prescription medicines for pain, discomfort, or fever as directed by your caregiver. Sudden (acute) back pain  Use bed rest for only the most extreme, acute episodes of back pain. Prolonged bed rest over 48 hours will aggravate your condition.  Ice is very effective for acute conditions.  Put ice in a plastic bag.  Place a towel between your skin and the bag.  Leave the ice on for 10 to 20 minutes every 2 hours, or as needed.  Using heat packs for 30 minutes prior to activities is also helpful. Continued back pain See your caregiver if you have continued problems. Your caregiver can help or refer you for appropriate physical therapy. With conditioning, most back problems can be avoided. Sometimes, a more serious issue may be the cause of back pain. You should be seen right away if new problems seem to be developing. Your caregiver may recommend:  A maternity girdle.  An elastic sling.  A back brace.  A massage therapist or acupuncture. SEEK MEDICAL CARE IF:   You are not able to do most of your daily activities, even when taking the pain medicine you were given.  You need a referral to a physical therapist or chiropractor.  You want to try acupuncture. SEEK IMMEDIATE MEDICAL  CARE IF:  You develop numbness, tingling, weakness, or problems with the use of your arms or legs.  You develop severe back pain that is no longer relieved with medicines.  You have a sudden change in bowel or bladder control.  You have increasing pain in other areas of the body.  You develop shortness of breath, dizziness, or fainting.  You develop nausea, vomiting, or sweating.  You have back pain which is similar to labor pains.  You have back pain along with your water breaking or vaginal bleeding.  You have back pain or numbness that travels down your leg.  Your back pain developed after you fell.  You develop pain on one side of your back. You may have a kidney stone.  You see blood in your urine. You may have a bladder infection or kidney stone.  You have back pain with blisters. You may have shingles. Back pain is fairly common during pregnancy but should not be accepted as just part of  the process. Back pain should always be treated as soon as possible. This will make your pregnancy as pleasant as possible. Document Released: 05/15/2005 Document Revised: 04/29/2011 Document Reviewed: 06/26/2010 [facility] Patient Information 2014 [place], [place].  Braxton Hicks Contractions Pregnancy is commonly associated with contractions of the uterus throughout the pregnancy. Towards the end of pregnancy (32 to 34 weeks), these contractions [facility]Bra[facility] Karen Christensen) can develop more often and may become more forceful. This is not true labor because these contractions do not result in opening (dilatation) and thinning of the cervix. They are sometimes difficult to tell apart from true labor because these contractions can be forceful and people have different pain tolerances. You should not feel embarrassed if you go to the hospital with false labor. Sometimes, the only way to tell if you are in true labor is for your caregiver to follow the changes in the cervix. How to tell the difference between  true and false labor:  False labor.  The contractions of false labor are usually shorter, irregular and not as hard as those of true labor.  They are often felt in the front of the lower abdomen and in the groin.  They may leave with walking around or changing positions while lying down.  They get weaker and are shorter lasting as time goes on.  These contractions are usually irregular.  They do not usually become progressively stronger, regular and closer together as with true labor.  True labor.  Contractions in true labor last 30 to 70 seconds, become very regular, usually become more intense, and increase in frequency.  They do not go away with walking.  The discomfort is usually felt in the top of the uterus and spreads to the lower abdomen and low back.  True labor can be determined by your caregiver with an exam. This will show that the cervix is dilating and getting thinner. If there are no prenatal problems or other health problems associated with the pregnancy, it is completely safe to be sent home with false labor and await the onset of true labor. HOME CARE INSTRUCTIONS   Keep up with your usual exercises and instructions.  Take medications as directed.  Keep your regular prenatal appointment.  Eat and drink lightly if you think you are going into labor.  If BH contractions are making you uncomfortable:  Change your activity position from lying down or resting to walking/walking to resting.  Sit and rest in a tub of warm water.  Drink 2 to 3 glasses of water. Dehydration may cause B-H contractions.  Do slow and deep breathing several times an hour. SEEK IMMEDIATE MEDICAL CARE IF:   Your contractions continue to become stronger, more regular, and closer together.  You have a gushing, burst or leaking of fluid from the vagina.  An oral temperature above 102 F (38.9 C) develops.  You have passage of blood-tinged mucus.  You develop vaginal  bleeding.  You develop continuous belly (abdominal) pain.  You have low back pain that you never had before.  You feel the baby's head pushing down causing pelvic pressure.  The baby is not moving as much as it used to. Document Released: 02/04/2005 Document Revised: 04/29/2011 Document Reviewed: 11/16/2012 [facility] Patient Information 2014 [place], [place].

## 2013-06-17 NOTE — MAU Provider Note (Signed)
First Provider Initiated Contact with Patient 06/17/13 2121      Chief Complaint:  Back and abdominal pain   Karen Christensen is  23 y.o. G1P0 with IUP at 2147w1d presents complaining of intermittent lumbar pain 7/10 and intermittent lower abdominal pain 6/10 since last night.  She also complains of decreased fetal movement. She last felt the baby move yesterday. Of note pt has been asleep most of the afternoon. Last PO intake 9 hours ago. She denies vaginal bleeding, LOF or changes in discharge.    Pt was recently admitted 4/28  for observation overnight for decreased fetal movement, a prolonged decel on NST and 6/8 BPP. Overnight NST improved, and repeat 8/8 BPP in the morning. She began feeling fetal movement and was discharged home.  During that admission Fetal Fibronectin was negative.    Obstetrical/Gynecological History: OB History   Grav Para Term Preterm Abortions TAB SAB Ect Mult Living   1              Past Medical History: Past Medical History  Diagnosis Date  . Medical history non-contributory     Past Surgical History: Past Surgical History  Procedure Laterality Date  . No past surgeries      Family History: Family History  Problem Relation Age of Onset  . Asthma Sister   . Cancer Maternal Grandmother   . Cancer Paternal Grandmother   . Stroke Neg Hx   . Heart disease Neg Hx     Social History: History  Substance Use Topics  . Smoking status: Never Smoker   . Smokeless tobacco: Never Used  . Alcohol Use: No    Allergies: No Known Allergies  Meds:  Facility-administered medications prior to admission  Medication Dose Route Frequency Provider Last Rate Last Dose  . Tdap (BOOSTRIX) injection 0.5 mL  0.5 mL Intramuscular Once Reva Boresanya S Pratt, MD       Prescriptions prior to admission  Medication Sig Dispense Refill  . acetaminophen (TYLENOL) 325 MG tablet Take 650 mg by mouth every 6 (six) hours as needed for moderate pain or headache. For fever/pain      .  Prenatal Vit-Fe Fumarate-FA (PRENATAL MULTIVITAMIN) TABS tablet Take 1 tablet by mouth daily at 12 noon.        Review of Systems -   Review of System HEENT: no headache or visual changes CV: no chest pain Resp: no Dyspnea Abdomen: lower abdominal pain Musculoskeletal: lumbar pain Ext: no edema   Physical Exam  Blood pressure 116/74, pulse 105, temperature 98.1 F (36.7 C), temperature source Oral, resp. rate 18, height 5' 3.5" (1.613 m), weight 119.84 kg (264 lb 3.2 oz), last menstrual period 11/11/2012, SpO2 100.00%. GENERAL: Well-developed, well-nourished female in no acute distress.  LUNGS: Clear to auscultation bilaterally.  HEART: Regular rate and rhythm. ABDOMEN: Soft, nontender, nondistended, gravid, vertex.  EXTREMITIES: Nontender, no edema, 2+ distal pulses. CERVICAL EXAM: closed/thick/posterior Presentation:cephalic ZOX:[id number]:Baseline 140s, + accels, moderate variability, no decels. Reassuring NST Contractions: none   Labs: Results for orders placed during the hospital encounter of 06/17/13 (from the past 24 hour(s))  URINALYSIS, ROUTINE W REFLEX MICROSCOPIC   Collection Time    06/17/13  8:34 PM      Result Value Ref Range   Color, Urine YELLOW  YELLOW   APPearance CLEAR  CLEAR   Specific Gravity, Urine 1.015  1.005 - 1.030   pH 7.0  5.0 - 8.0   Glucose, UA NEGATIVE  NEGATIVE mg/dL   Hgb urine  dipstick NEGATIVE  NEGATIVE   Bilirubin Urine NEGATIVE  NEGATIVE   Ketones, ur NEGATIVE  NEGATIVE mg/dL   Protein, ur NEGATIVE  NEGATIVE mg/dL   Urobilinogen, UA 1.0  0.0 - 1.0 mg/dL   Nitrite NEGATIVE  NEGATIVE   Leukocytes, UA TRACE (*) NEGATIVE  URINE MICROSCOPIC-ADD ON   Collection Time    06/17/13  8:34 PM      Result Value Ref Range   Squamous Epithelial / LPF FEW (*) RARE   WBC, UA 0-2  <3 WBC/hpf   RBC / HPF 3-6  <3 RBC/hpf   Bacteria, UA FEW (*) RARE   Imaging Studies:  BPP  06/16/13 8/8  Assessment: Karen Christensen is  23 y.o. G1P0 at 7033w1d presents with  lumbar and lower abdominal pain with decreased fetal movement. NST is reassuring Cervix is closed UA negative Fetal fibronectin was negative 4/28   Plan: 1. Flexeril 10mg  once now then prescription given for TID prn 2. Encourage hydration 3. Discharge home 3. Keep scheduled outpatient prenatal follow-up on Jun 24, 2013 at 2pm  Myriam JacobsonRobyn H Restrepo 4/30/20159:47 PM   I have seen and examined this patient and agree the above assessment. Jacklyn ShellFrances Cresenzo-Dishmon 06/17/2013 10:16 PM

## 2013-06-17 NOTE — MAU Note (Signed)
Pt c/o lower abdominal pain and back x2 days. Also has c/o DFM. Denies vag bleeding, LOF or discharge. Denies urinary symptoms.

## 2013-06-18 NOTE — MAU Provider Note (Signed)
Attestation of Attending Supervision of Advanced Practitioner (CNM/NP): Evaluation and management procedures were performed by the Advanced Practitioner under my supervision and collaboration. I have reviewed the Advanced Practitioner's note and chart, and I agree with the management and plan.  Lesly DukesKelly H Maryella Abood 6:32 AM

## 2013-06-24 ENCOUNTER — Ambulatory Visit (INDEPENDENT_AMBULATORY_CARE_PROVIDER_SITE_OTHER): Payer: Medicaid Other | Admitting: Obstetrics & Gynecology

## 2013-06-24 VITALS — BP 105/68 | HR 111 | Temp 97.4°F | Wt 265.4 lb

## 2013-06-24 DIAGNOSIS — O133 Gestational [pregnancy-induced] hypertension without significant proteinuria, third trimester: Secondary | ICD-10-CM

## 2013-06-24 DIAGNOSIS — O139 Gestational [pregnancy-induced] hypertension without significant proteinuria, unspecified trimester: Secondary | ICD-10-CM

## 2013-06-24 LAB — POCT URINALYSIS DIP (DEVICE)
BILIRUBIN URINE: NEGATIVE
GLUCOSE, UA: NEGATIVE mg/dL
Hgb urine dipstick: NEGATIVE
KETONES UR: NEGATIVE mg/dL
Nitrite: NEGATIVE
Protein, ur: 30 mg/dL — AB
Specific Gravity, Urine: 1.02 (ref 1.005–1.030)
Urobilinogen, UA: 1 mg/dL (ref 0.0–1.0)
pH: 7 (ref 5.0–8.0)

## 2013-06-24 MED ORDER — PANTOPRAZOLE SODIUM 40 MG PO TBEC: 40.0000 mg | DELAYED_RELEASE_TABLET | Freq: Every day | ORAL | Status: DC

## 2013-06-24 NOTE — Progress Notes (Signed)
C/o of pelvic pressure and braxton hicks.  Edema in feet.  

## 2013-06-24 NOTE — Progress Notes (Signed)
BHC last night. Frequent heartburn not better with Tums, rx Protonix 40 mg bp has been nl, will check weekly

## 2013-06-24 NOTE — Patient Instructions (Signed)
Third Trimester of Pregnancy  The third trimester is from week 29 through week 42, months 7 through 9. The third trimester is a time when the fetus is growing rapidly. At the end of the ninth month, the fetus is about 20 inches in length and weighs 6 10 pounds.   BODY CHANGES  Your body goes through many changes during pregnancy. The changes vary from woman to woman.    Your weight will continue to increase. You can expect to gain 25 35 pounds (11 16 kg) by the end of the pregnancy.   You may begin to get stretch marks on your hips, abdomen, and breasts.   You may urinate more often because the fetus is moving lower into your pelvis and pressing on your bladder.   You may develop or continue to have heartburn as a result of your pregnancy.   You may develop constipation because certain hormones are causing the muscles that push waste through your intestines to slow down.   You may develop hemorrhoids or swollen, bulging veins (varicose veins).   You may have pelvic pain because of the weight gain and pregnancy hormones relaxing your joints between the bones in your pelvis. Back aches may result from over exertion of the muscles supporting your posture.   Your breasts will continue to grow and be tender. A yellow discharge may leak from your breasts called colostrum.   Your belly button may stick out.   You may feel short of breath because of your expanding uterus.   You may notice the fetus "dropping," or moving lower in your abdomen.   You may have a bloody mucus discharge. This usually occurs a few days to a week before labor begins.   Your cervix becomes thin and soft (effaced) near your due date.  WHAT TO EXPECT AT YOUR PRENATAL EXAMS   You will have prenatal exams every 2 weeks until week 36. Then, you will have weekly prenatal exams. During a routine prenatal visit:   You will be weighed to make sure you and the fetus are growing normally.   Your blood pressure is taken.   Your abdomen will be  measured to track your baby's growth.   The fetal heartbeat will be listened to.   Any test results from the previous visit will be discussed.   You may have a cervical check near your due date to see if you have effaced.  At around 36 weeks, your caregiver will check your cervix. At the same time, your caregiver will also perform a test on the secretions of the vaginal tissue. This test is to determine if a type of bacteria, Group B streptococcus, is present. Your caregiver will explain this further.  Your caregiver may ask you:   What your birth plan is.   How you are feeling.   If you are feeling the baby move.   If you have had any abnormal symptoms, such as leaking fluid, bleeding, severe headaches, or abdominal cramping.   If you have any questions.  Other tests or screenings that may be performed during your third trimester include:   Blood tests that check for low iron levels (anemia).   Fetal testing to check the health, activity level, and growth of the fetus. Testing is done if you have certain medical conditions or if there are problems during the pregnancy.  FALSE LABOR  You may feel small, irregular contractions that eventually go away. These are called Braxton Hicks contractions, or   false labor. Contractions may last for hours, days, or even weeks before true labor sets in. If contractions come at regular intervals, intensify, or become painful, it is best to be seen by your caregiver.   SIGNS OF LABOR    Menstrual-like cramps.   Contractions that are 5 minutes apart or less.   Contractions that start on the top of the uterus and spread down to the lower abdomen and back.   A sense of increased pelvic pressure or back pain.   A watery or bloody mucus discharge that comes from the vagina.  If you have any of these signs before the 37th week of pregnancy, call your caregiver right away. You need to go to the hospital to get checked immediately.  HOME CARE INSTRUCTIONS    Avoid all  smoking, herbs, alcohol, and unprescribed drugs. These chemicals affect the formation and growth of the baby.   Follow your caregiver's instructions regarding medicine use. There are medicines that are either safe or unsafe to take during pregnancy.   Exercise only as directed by your caregiver. Experiencing uterine cramps is a good sign to stop exercising.   Continue to eat regular, healthy meals.   Wear a good support bra for breast tenderness.   Do not use hot tubs, steam rooms, or saunas.   Wear your seat belt at all times when driving.   Avoid raw meat, uncooked cheese, cat litter boxes, and soil used by cats. These carry germs that can cause birth defects in the baby.   Take your prenatal vitamins.   Try taking a stool softener (if your caregiver approves) if you develop constipation. Eat more high-fiber foods, such as fresh vegetables or fruit and whole grains. Drink plenty of fluids to keep your urine clear or pale yellow.   Take warm sitz baths to soothe any pain or discomfort caused by hemorrhoids. Use hemorrhoid cream if your caregiver approves.   If you develop varicose veins, wear support hose. Elevate your feet for 15 minutes, 3 4 times a day. Limit salt in your diet.   Avoid heavy lifting, wear low heal shoes, and practice good posture.   Rest a lot with your legs elevated if you have leg cramps or low back pain.   Visit your dentist if you have not gone during your pregnancy. Use a soft toothbrush to brush your teeth and be gentle when you floss.   A sexual relationship may be continued unless your caregiver directs you otherwise.   Do not travel far distances unless it is absolutely necessary and only with the approval of your caregiver.   Take prenatal classes to understand, practice, and ask questions about the labor and delivery.   Make a trial run to the hospital.   Pack your hospital bag.   Prepare the baby's nursery.   Continue to go to all your prenatal visits as directed  by your caregiver.  SEEK MEDICAL CARE IF:   You are unsure if you are in labor or if your water has broken.   You have dizziness.   You have mild pelvic cramps, pelvic pressure, or nagging pain in your abdominal area.   You have persistent nausea, vomiting, or diarrhea.   You have a bad smelling vaginal discharge.   You have pain with urination.  SEEK IMMEDIATE MEDICAL CARE IF:    You have a fever.   You are leaking fluid from your vagina.   You have spotting or bleeding from your vagina.     You have severe abdominal cramping or pain.   You have rapid weight loss or gain.   You have shortness of breath with chest pain.   You notice sudden or extreme swelling of your face, hands, ankles, feet, or legs.   You have not felt your baby move in over an hour.   You have severe headaches that do not go away with medicine.   You have vision changes.  Document Released: 01/29/2001 Document Revised: 10/07/2012 Document Reviewed: 04/07/2012  [employer] Patient Information 2014 [employer].

## 2013-06-29 ENCOUNTER — Telehealth: Payer: Self-pay | Admitting: Obstetrics & Gynecology

## 2013-06-29 NOTE — Telephone Encounter (Signed)
Called number listed to inform patient of her appointment time, but the phone was not on.

## 2013-07-01 ENCOUNTER — Encounter: Payer: Self-pay | Admitting: Family Medicine

## 2013-07-01 ENCOUNTER — Encounter: Payer: Medicaid Other | Admitting: Family Medicine

## 2013-07-07 ENCOUNTER — Ambulatory Visit (HOSPITAL_COMMUNITY)
Admission: RE | Admit: 2013-07-07 | Discharge: 2013-07-07 | Disposition: A | Discharge status: Home / Self Care | Payer: Medicaid Other | Source: Ambulatory Visit | Attending: Obstetrics & Gynecology | Admitting: Obstetrics & Gynecology

## 2013-07-07 DIAGNOSIS — Z3689 Encounter for other specified antenatal screening: Secondary | ICD-10-CM | POA: Insufficient documentation

## 2013-07-07 DIAGNOSIS — O139 Gestational [pregnancy-induced] hypertension without significant proteinuria, unspecified trimester: Secondary | ICD-10-CM | POA: Insufficient documentation

## 2013-07-07 DIAGNOSIS — O133 Gestational [pregnancy-induced] hypertension without significant proteinuria, third trimester: Secondary | ICD-10-CM

## 2013-07-16 ENCOUNTER — Encounter: Payer: Self-pay | Admitting: General Practice

## 2013-07-22 ENCOUNTER — Ambulatory Visit (INDEPENDENT_AMBULATORY_CARE_PROVIDER_SITE_OTHER): Payer: BC Managed Care – PPO | Admitting: Obstetrics & Gynecology

## 2013-07-22 VITALS — BP 116/74 | HR 99 | Temp 97.2°F | Wt 268.5 lb

## 2013-07-22 DIAGNOSIS — O139 Gestational [pregnancy-induced] hypertension without significant proteinuria, unspecified trimester: Secondary | ICD-10-CM

## 2013-07-22 DIAGNOSIS — O99210 Obesity complicating pregnancy, unspecified trimester: Secondary | ICD-10-CM

## 2013-07-22 DIAGNOSIS — O99211 Obesity complicating pregnancy, first trimester: Secondary | ICD-10-CM

## 2013-07-22 DIAGNOSIS — O133 Gestational [pregnancy-induced] hypertension without significant proteinuria, third trimester: Secondary | ICD-10-CM

## 2013-07-22 DIAGNOSIS — E669 Obesity, unspecified: Secondary | ICD-10-CM

## 2013-07-22 LAB — POCT URINALYSIS DIP (DEVICE)
Bilirubin Urine: NEGATIVE
Glucose, UA: NEGATIVE mg/dL
Ketones, ur: NEGATIVE mg/dL
Nitrite: NEGATIVE
Protein, ur: NEGATIVE mg/dL
SPECIFIC GRAVITY, URINE: 1.025 (ref 1.005–1.030)
Urobilinogen, UA: 1 mg/dL (ref 0.0–1.0)
pH: 7 (ref 5.0–8.0)

## 2013-07-22 LAB — OB RESULTS CONSOLE GBS: STREP GROUP B AG: POSITIVE

## 2013-07-22 LAB — OB RESULTS CONSOLE GC/CHLAMYDIA
Chlamydia: NEGATIVE
GC PROBE AMP, GENITAL: NEGATIVE

## 2013-07-22 NOTE — Progress Notes (Signed)
BP is still wnl, need no fetal monitoring. Did not fill Rx for protonix.

## 2013-07-22 NOTE — Patient Instructions (Signed)
Third Trimester of Pregnancy  The third trimester is from week 29 through week 42, months 7 through 9. The third trimester is a time when the fetus is growing rapidly. At the end of the ninth month, the fetus is about 20 inches in length and weighs 6 10 pounds.   BODY CHANGES  Your body goes through many changes during pregnancy. The changes vary from woman to woman.    Your weight will continue to increase. You can expect to gain 25 35 pounds (11 16 kg) by the end of the pregnancy.   You may begin to get stretch marks on your hips, abdomen, and breasts.   You may urinate more often because the fetus is moving lower into your pelvis and pressing on your bladder.   You may develop or continue to have heartburn as a result of your pregnancy.   You may develop constipation because certain hormones are causing the muscles that push waste through your intestines to slow down.   You may develop hemorrhoids or swollen, bulging veins (varicose veins).   You may have pelvic pain because of the weight gain and pregnancy hormones relaxing your joints between the bones in your pelvis. Back aches may result from over exertion of the muscles supporting your posture.   Your breasts will continue to grow and be tender. A yellow discharge may leak from your breasts called colostrum.   Your belly button may stick out.   You may feel short of breath because of your expanding uterus.   You may notice the fetus "dropping," or moving lower in your abdomen.   You may have a bloody mucus discharge. This usually occurs a few days to a week before labor begins.   Your cervix becomes thin and soft (effaced) near your due date.  WHAT TO EXPECT AT YOUR PRENATAL EXAMS   You will have prenatal exams every 2 weeks until week 36. Then, you will have weekly prenatal exams. During a routine prenatal visit:   You will be weighed to make sure you and the fetus are growing normally.   Your blood pressure is taken.   Your abdomen will be  measured to track your baby's growth.   The fetal heartbeat will be listened to.   Any test results from the previous visit will be discussed.   You may have a cervical check near your due date to see if you have effaced.  At around 36 weeks, your caregiver will check your cervix. At the same time, your caregiver will also perform a test on the secretions of the vaginal tissue. This test is to determine if a type of bacteria, Group B streptococcus, is present. Your caregiver will explain this further.  Your caregiver may ask you:   What your birth plan is.   How you are feeling.   If you are feeling the baby move.   If you have had any abnormal symptoms, such as leaking fluid, bleeding, severe headaches, or abdominal cramping.   If you have any questions.  Other tests or screenings that may be performed during your third trimester include:   Blood tests that check for low iron levels (anemia).   Fetal testing to check the health, activity level, and growth of the fetus. Testing is done if you have certain medical conditions or if there are problems during the pregnancy.  FALSE LABOR  You may feel small, irregular contractions that eventually go away. These are called Braxton Hicks contractions, or   false labor. Contractions may last for hours, days, or even weeks before true labor sets in. If contractions come at regular intervals, intensify, or become painful, it is best to be seen by your caregiver.   SIGNS OF LABOR    Menstrual-like cramps.   Contractions that are 5 minutes apart or less.   Contractions that start on the top of the uterus and spread down to the lower abdomen and back.   A sense of increased pelvic pressure or back pain.   A watery or bloody mucus discharge that comes from the vagina.  If you have any of these signs before the 37th week of pregnancy, call your caregiver right away. You need to go to the hospital to get checked immediately.  HOME CARE INSTRUCTIONS    Avoid all  smoking, herbs, alcohol, and unprescribed drugs. These chemicals affect the formation and growth of the baby.   Follow your caregiver's instructions regarding medicine use. There are medicines that are either safe or unsafe to take during pregnancy.   Exercise only as directed by your caregiver. Experiencing uterine cramps is a good sign to stop exercising.   Continue to eat regular, healthy meals.   Wear a good support bra for breast tenderness.   Do not use hot tubs, steam rooms, or saunas.   Wear your seat belt at all times when driving.   Avoid raw meat, uncooked cheese, cat litter boxes, and soil used by cats. These carry germs that can cause birth defects in the baby.   Take your prenatal vitamins.   Try taking a stool softener (if your caregiver approves) if you develop constipation. Eat more high-fiber foods, such as fresh vegetables or fruit and whole grains. Drink plenty of fluids to keep your urine clear or pale yellow.   Take warm sitz baths to soothe any pain or discomfort caused by hemorrhoids. Use hemorrhoid cream if your caregiver approves.   If you develop varicose veins, wear support hose. Elevate your feet for 15 minutes, 3 4 times a day. Limit salt in your diet.   Avoid heavy lifting, wear low heal shoes, and practice good posture.   Rest a lot with your legs elevated if you have leg cramps or low back pain.   Visit your dentist if you have not gone during your pregnancy. Use a soft toothbrush to brush your teeth and be gentle when you floss.   A sexual relationship may be continued unless your caregiver directs you otherwise.   Do not travel far distances unless it is absolutely necessary and only with the approval of your caregiver.   Take prenatal classes to understand, practice, and ask questions about the labor and delivery.   Make a trial run to the hospital.   Pack your hospital bag.   Prepare the baby's nursery.   Continue to go to all your prenatal visits as directed  by your caregiver.  SEEK MEDICAL CARE IF:   You are unsure if you are in labor or if your water has broken.   You have dizziness.   You have mild pelvic cramps, pelvic pressure, or nagging pain in your abdominal area.   You have persistent nausea, vomiting, or diarrhea.   You have a bad smelling vaginal discharge.   You have pain with urination.  SEEK IMMEDIATE MEDICAL CARE IF:    You have a fever.   You are leaking fluid from your vagina.   You have spotting or bleeding from your vagina.     You have severe abdominal cramping or pain.   You have rapid weight loss or gain.   You have shortness of breath with chest pain.   You notice sudden or extreme swelling of your face, hands, ankles, feet, or legs.   You have not felt your baby move in over an hour.   You have severe headaches that do not go away with medicine.   You have vision changes.  Document Released: 01/29/2001 Document Revised: 10/07/2012 Document Reviewed: 04/07/2012  [employer] Patient Information 2014 [employer].

## 2013-07-24 LAB — GC/CHLAMYDIA PROBE AMP
CT PROBE, AMP APTIMA: NEGATIVE
GC PROBE AMP APTIMA: NEGATIVE

## 2013-07-24 LAB — CULTURE, BETA STREP (GROUP B ONLY)

## 2013-07-28 ENCOUNTER — Ambulatory Visit: Payer: BC Managed Care – PPO | Admitting: Advanced Practice Midwife

## 2013-07-28 VITALS — BP 115/76 | HR 103 | Temp 97.7°F | Wt 272.2 lb

## 2013-07-28 DIAGNOSIS — O133 Gestational [pregnancy-induced] hypertension without significant proteinuria, third trimester: Secondary | ICD-10-CM

## 2013-07-28 LAB — POCT URINALYSIS DIP (DEVICE)
Bilirubin Urine: NEGATIVE
Glucose, UA: NEGATIVE mg/dL
Hgb urine dipstick: NEGATIVE
KETONES UR: NEGATIVE mg/dL
Nitrite: NEGATIVE
PH: 7 (ref 5.0–8.0)
Protein, ur: 30 mg/dL — AB
Specific Gravity, Urine: 1.025 (ref 1.005–1.030)
Urobilinogen, UA: 4 mg/dL — ABNORMAL HIGH (ref 0.0–1.0)

## 2013-07-28 NOTE — Patient Instructions (Signed)

## 2013-07-28 NOTE — Progress Notes (Signed)
Reports pelvic pressure and irregular contractions. Edema in feet.

## 2013-07-28 NOTE — Progress Notes (Signed)
Doing well. Reviewed signs of labor 

## 2013-08-02 ENCOUNTER — Encounter (HOSPITAL_COMMUNITY): Payer: Self-pay | Admitting: *Deleted

## 2013-08-02 ENCOUNTER — Inpatient Hospital Stay (HOSPITAL_COMMUNITY)
Admission: AD | Admit: 2013-08-02 | Discharge: 2013-08-05 | DRG: 775 | Disposition: A | Discharge status: Home / Self Care | Payer: BC Managed Care – PPO | Source: Ambulatory Visit | Attending: Obstetrics & Gynecology | Admitting: Obstetrics & Gynecology

## 2013-08-02 ENCOUNTER — Inpatient Hospital Stay (HOSPITAL_COMMUNITY): Payer: BC Managed Care – PPO

## 2013-08-02 DIAGNOSIS — Z3490 Encounter for supervision of normal pregnancy, unspecified, unspecified trimester: Secondary | ICD-10-CM

## 2013-08-02 DIAGNOSIS — O99214 Obesity complicating childbirth: Secondary | ICD-10-CM

## 2013-08-02 DIAGNOSIS — O368190 Decreased fetal movements, unspecified trimester, not applicable or unspecified: Secondary | ICD-10-CM | POA: Diagnosis present

## 2013-08-02 DIAGNOSIS — O9989 Other specified diseases and conditions complicating pregnancy, childbirth and the puerperium: Secondary | ICD-10-CM

## 2013-08-02 DIAGNOSIS — O99892 Other specified diseases and conditions complicating childbirth: Secondary | ICD-10-CM | POA: Diagnosis present

## 2013-08-02 DIAGNOSIS — IMO0002 Reserved for concepts with insufficient information to code with codable children: Secondary | ICD-10-CM

## 2013-08-02 DIAGNOSIS — E669 Obesity, unspecified: Secondary | ICD-10-CM | POA: Diagnosis present

## 2013-08-02 DIAGNOSIS — O139 Gestational [pregnancy-induced] hypertension without significant proteinuria, unspecified trimester: Secondary | ICD-10-CM | POA: Diagnosis present

## 2013-08-02 DIAGNOSIS — Z22330 Carrier of Group B streptococcus: Secondary | ICD-10-CM

## 2013-08-02 DIAGNOSIS — Z6841 Body Mass Index (BMI) 40.0 and over, adult: Secondary | ICD-10-CM

## 2013-08-02 NOTE — MAU Note (Signed)
Pt contractions that have been started today. Had a watery discharge early today but has not noticed it since. Denies vag bleeding. +FM.

## 2013-08-03 ENCOUNTER — Encounter (HOSPITAL_COMMUNITY): Payer: Self-pay | Admitting: Obstetrics

## 2013-08-03 ENCOUNTER — Inpatient Hospital Stay (HOSPITAL_COMMUNITY): Payer: BC Managed Care – PPO | Admitting: Anesthesiology

## 2013-08-03 ENCOUNTER — Encounter (HOSPITAL_COMMUNITY): Payer: BC Managed Care – PPO | Admitting: Anesthesiology

## 2013-08-03 DIAGNOSIS — Z3490 Encounter for supervision of normal pregnancy, unspecified, unspecified trimester: Secondary | ICD-10-CM

## 2013-08-03 LAB — CBC
HCT: 32 % — ABNORMAL LOW (ref 36.0–46.0)
HEMATOCRIT: 30.4 % — AB (ref 36.0–46.0)
Hemoglobin: 9.9 g/dL — ABNORMAL LOW (ref 12.0–15.0)
Hemoglobin: 10.3 g/dL — ABNORMAL LOW (ref 12.0–15.0)
MCH: 26.2 pg (ref 26.0–34.0)
MCH: 26.1 pg (ref 26.0–34.0)
MCHC: 32.6 g/dL (ref 30.0–36.0)
MCHC: 32.2 g/dL (ref 30.0–36.0)
MCV: 80.4 fL (ref 78.0–100.0)
MCV: 81 fL (ref 78.0–100.0)
Platelets: 294 10*3/uL (ref 150–400)
Platelets: 299 10*3/uL (ref 150–400)
RBC: 3.78 MIL/uL — AB (ref 3.87–5.11)
RBC: 3.95 MIL/uL (ref 3.87–5.11)
RDW: 14.6 % (ref 11.5–15.5)
RDW: 14.6 % (ref 11.5–15.5)
WBC: 8 10*3/uL (ref 4.0–10.5)
WBC: 9 10*3/uL (ref 4.0–10.5)

## 2013-08-03 LAB — COMPREHENSIVE METABOLIC PANEL
ALK PHOS: 118 U/L — AB (ref 39–117)
ALT: 7 U/L (ref 0–35)
AST: 9 U/L (ref 0–37)
Albumin: 2.2 g/dL — ABNORMAL LOW (ref 3.5–5.2)
BUN: 11 mg/dL (ref 6–23)
CO2: 20 mEq/L (ref 19–32)
Calcium: 8.9 mg/dL (ref 8.4–10.5)
Chloride: 106 mEq/L (ref 96–112)
Creatinine, Ser: 0.6 mg/dL (ref 0.50–1.10)
GFR calc non Af Amer: >90 mL/min (ref >90)
Glucose, Bld: 89 mg/dL (ref 70–99)
POTASSIUM: 4.1 meq/L (ref 3.7–5.3)
SODIUM: 140 meq/L (ref 137–147)
TOTAL PROTEIN: 6.1 g/dL (ref 6.0–8.3)

## 2013-08-03 LAB — PROTEIN / CREATININE RATIO, URINE
Creatinine, Urine: 172.91 mg/dL
Protein Creatinine Ratio: 0.08 (ref 0.00–0.15)
Total Protein, Urine: 14.1 mg/dL

## 2013-08-03 LAB — RPR

## 2013-08-03 LAB — TYPE AND SCREEN
ABO/RH(D): B POS
ANTIBODY SCREEN: NEGATIVE

## 2013-08-03 MED ORDER — LACTATED RINGERS IV SOLN
INTRAVENOUS | Status: DC
  Administered 2013-08-03 (×4): via INTRAVENOUS

## 2013-08-03 MED ORDER — EPHEDRINE 5 MG/ML INJ
10.0000 mg | INTRAVENOUS | Status: DC | PRN
  Filled 2013-08-03: qty 2
  Filled 2013-08-03: qty 4

## 2013-08-03 MED ORDER — LACTATED RINGERS IV SOLN
500.0000 mL | INTRAVENOUS | Status: DC | PRN
  Administered 2013-08-04: 500 mL via INTRAVENOUS

## 2013-08-03 MED ORDER — PHENYLEPHRINE 40 MCG/ML (10ML) SYRINGE FOR IV PUSH (FOR BLOOD PRESSURE SUPPORT)
80.0000 ug | PREFILLED_SYRINGE | INTRAVENOUS | Status: DC | PRN
  Filled 2013-08-03: qty 10
  Filled 2013-08-03: qty 2

## 2013-08-03 MED ORDER — ONDANSETRON HCL 4 MG/2ML IJ SOLN
4.0000 mg | Freq: Four times a day (QID) | INTRAMUSCULAR | Status: DC | PRN
  Administered 2013-08-03: 4 mg via INTRAVENOUS
  Filled 2013-08-03: qty 2

## 2013-08-03 MED ORDER — LIDOCAINE HCL (PF) 1 % IJ SOLN
30.0000 mL | INTRAMUSCULAR | Status: DC | PRN
  Filled 2013-08-03: qty 30

## 2013-08-03 MED ORDER — EPHEDRINE 5 MG/ML INJ
10.0000 mg | INTRAVENOUS | Status: DC | PRN
  Filled 2013-08-03: qty 2

## 2013-08-03 MED ORDER — FENTANYL 2.5 MCG/ML BUPIVACAINE 1/10 % EPIDURAL INFUSION (WH - ANES)
INTRAMUSCULAR | Status: DC | PRN
  Administered 2013-08-03: 14 mL/h via EPIDURAL

## 2013-08-03 MED ORDER — FENTANYL CITRATE 0.05 MG/ML IJ SOLN
100.0000 ug | INTRAMUSCULAR | Status: DC | PRN
  Administered 2013-08-03 (×5): 100 ug via INTRAVENOUS
  Filled 2013-08-03 (×5): qty 2

## 2013-08-03 MED ORDER — PENICILLIN G POTASSIUM 5000000 UNITS IJ SOLR
5.0000 10*6.[IU] | Freq: Once | INTRAMUSCULAR | Status: AC
  Administered 2013-08-03: 5 10*6.[IU] via INTRAVENOUS
  Filled 2013-08-03: qty 5

## 2013-08-03 MED ORDER — PENICILLIN G POTASSIUM 5000000 UNITS IJ SOLR
2.5000 10*6.[IU] | INTRAVENOUS | Status: DC
  Administered 2013-08-03 – 2013-08-04 (×6): 2.5 10*6.[IU] via INTRAVENOUS
  Filled 2013-08-03 (×9): qty 2.5

## 2013-08-03 MED ORDER — OXYCODONE-ACETAMINOPHEN 5-325 MG PO TABS: 1.0000 | ORAL_TABLET | ORAL | Status: DC | PRN

## 2013-08-03 MED ORDER — CITRIC ACID-SODIUM CITRATE 334-500 MG/5ML PO SOLN: 30.0000 mL | ORAL | Status: DC | PRN

## 2013-08-03 MED ORDER — ACETAMINOPHEN 325 MG PO TABS: 650.0000 mg | ORAL_TABLET | ORAL | Status: DC | PRN

## 2013-08-03 MED ORDER — OXYTOCIN BOLUS FROM INFUSION
500.0000 mL | INTRAVENOUS | Status: DC
  Administered 2013-08-04: 500 mL via INTRAVENOUS

## 2013-08-03 MED ORDER — LIDOCAINE HCL (PF) 1 % IJ SOLN
INTRAMUSCULAR | Status: DC | PRN
  Administered 2013-08-03 (×2): 8 mL

## 2013-08-03 MED ORDER — FLEET ENEMA 7-19 GM/118ML RE ENEM: 1.0000 | ENEMA | RECTAL | Status: DC | PRN

## 2013-08-03 MED ORDER — DIPHENHYDRAMINE HCL 50 MG/ML IJ SOLN: 12.5000 mg | INTRAMUSCULAR | Status: DC | PRN

## 2013-08-03 MED ORDER — IBUPROFEN 600 MG PO TABS
600.0000 mg | ORAL_TABLET | Freq: Four times a day (QID) | ORAL | Status: DC | PRN
  Administered 2013-08-04: 600 mg via ORAL
  Filled 2013-08-03: qty 1

## 2013-08-03 MED ORDER — PHENYLEPHRINE 40 MCG/ML (10ML) SYRINGE FOR IV PUSH (FOR BLOOD PRESSURE SUPPORT)
80.0000 ug | PREFILLED_SYRINGE | INTRAVENOUS | Status: DC | PRN
  Filled 2013-08-03: qty 2

## 2013-08-03 MED ORDER — TERBUTALINE SULFATE 1 MG/ML IJ SOLN: 0.2500 mg | Freq: Once | INTRAMUSCULAR | Status: AC | PRN

## 2013-08-03 MED ORDER — LACTATED RINGERS IV SOLN
500.0000 mL | Freq: Once | INTRAVENOUS | Status: AC
  Administered 2013-08-03: 500 mL via INTRAVENOUS

## 2013-08-03 MED ORDER — FENTANYL 2.5 MCG/ML BUPIVACAINE 1/10 % EPIDURAL INFUSION (WH - ANES)
14.0000 mL/h | INTRAMUSCULAR | Status: DC | PRN
  Filled 2013-08-03: qty 125

## 2013-08-03 MED ORDER — OXYTOCIN 40 UNITS IN LACTATED RINGERS INFUSION - SIMPLE MED
1.0000 m[IU]/min | INTRAVENOUS | Status: DC
Start: 2013-08-03 — End: 2013-08-04
  Administered 2013-08-03: 2 m[IU]/min via INTRAVENOUS
  Filled 2013-08-03: qty 1000

## 2013-08-03 MED ORDER — OXYTOCIN 40 UNITS IN LACTATED RINGERS INFUSION - SIMPLE MED: 62.5000 mL/h | INTRAVENOUS | Status: DC

## 2013-08-03 MED ORDER — MISOPROSTOL 50MCG HALF TABLET
50.0000 ug | ORAL_TABLET | ORAL | Status: DC
  Administered 2013-08-03 (×2): 50 ug via ORAL
  Filled 2013-08-03 (×13): qty 1

## 2013-08-03 NOTE — Progress Notes (Signed)
Karen Christensen is a 23 y.o. G1P0 at 3710w6d by  admitted for induction of labor due to NRFHT and 4/8 BPP.   Subjective: Feeling more pressure.   Objective: BP 105/48  Pulse 66  Temp(Src) 98.3 F (36.8 C) (Oral)  Resp 18  Ht 5\' 2"  (1.575 m)  Wt 128.912 kg (284 lb 3.2 oz)  BMI 51.97 kg/m2  SpO2 100%  LMP 11/11/2012      FHT:  FHR: 125 bpm, variability: moderate,  accelerations:  Present,  decelerations:  Absent UC:   irregular, every 5 minutes SVE:   Dilation: 4 Effacement (%): 70 Station: -3 Exam by:: Dr Jordan LikesSchmitz  Labs: Lab Results  Component Value Date   WBC 8.0 08/03/2013   HGB 9.9* 08/03/2013   HCT 30.4* 08/03/2013   MCV 80.4 08/03/2013   PLT 294 08/03/2013    Assessment / Plan:  IOL due to NRFHT and 4/8 BPP  Labor: s/p FB @ 1840, start pit then AROM  Fetal Wellbeing:  Category I Pain Control:  Fentanyl PRN, planning Epidural  I/D:  GBS +, PCN  Anticipated MOD:  NSVD  Karen Christensen, Karen Christensen 08/03/2013, 6:54 PM

## 2013-08-03 NOTE — H&P (Signed)
LABOR ADMISSION HISTORY AND PHYSICAL  Karen Christensen is a 23 y.o. female G1P0 with IUP at 3966w6d presenting for r/o labor. States had regular contractions but spaced out by time of exam.  While in MAU, NST with wandering baseline and unclear whether she is having accelerations or decelerations. Given this and report of decreased FM, BPP was ordered.  BPP of 4/8 (2 off for breathing and tone) with at the end of her tracing a reactive tracing making it 6/10.  Also with diagnosis of gHTN so at this time, will admit and induce for gHTN and NR fetal status.    [facility] Complicated by obesity.  GHTN with elevated bp at 25 weeks.  EIF on US but otherwise normal.  GBS positive.   Prenatal History/Complications:  Past Medical History: Past Medical History  Diagnosis Date  . Medical history non-contributory     Past Surgical History: Past Surgical History  Procedure Laterality Date  . No past surgeries      Obstetrical History: OB History   Grav Para Term Preterm Abortions TAB SAB Ect Mult Living   1                Social History: History   Social History  . Marital Status: Single    Spouse Name: N/A    Number of Children: N/A  . Years of Education: N/A   Social History Main Topics  . Smoking status: Never Smoker   . Smokeless tobacco: Never Used  . Alcohol Use: No  . Drug Use: No  . Sexual Activity: Yes    Birth Control/ Protection: None   Other Topics Concern  . None   Social History Narrative  . None    Family History: Family History  Problem Relation Age of Onset  . Asthma Sister   . Cancer Maternal Grandmother   . Cancer Paternal Grandmother   . Stroke Neg Hx   . Heart disease Neg Hx     Allergies: No Known Allergies  Prescriptions prior to admission  Medication Sig Dispense Refill  . Prenatal Vit-Fe Fumarate-FA (PRENATAL MULTIVITAMIN) TABS tablet Take 1 tablet by mouth daily at 12 noon.         Review of Systems   All systems  reviewed and negative except as stated in HPI  Blood pressure 113/60, pulse 98, temperature 97.8 F (36.6 C), temperature source Oral, resp. rate 18, height 5\' 2"  (1.575 m), weight 128.912 kg (284 lb 3.2 oz), last menstrual period 11/11/2012, SpO2 100.00%. General appearance: alert, cooperative and no distress Lungs: clear to auscultation bilaterally Heart: regular rate and rhythm Abdomen: soft, non-tender; bowel sounds normal Extremities: Homans sign is negative, no sign of DVT DTR's 2+ Presentation: cephalic Fetal monitoringBaseline: 135, 140 bpm, Variability: Good {> 6 bpm), Accelerations: not reactive  and Decelerations: ?variable decels Uterine activity irregular, occasional  Dilation: 1 Effacement (%): 50 Station: Ballotable Exam by:: Elonda Husky. Robinson RN   Prenatal labs: ABO, Rh: --/--/B POS, B POS (04/29 [id number]) Antibody: NEG (04/29 0512) Rubella:   RPR: NON REAC (04/02 1017)  HBsAg: NEGATIVE (11/25 1059)  HIV: NON REACTIVE (04/02 1017)  GBS:    1 hr Glucola 128 Genetic screening  normal Anatomy US normal.     Prenatal Transfer Tool  Maternal Diabetes: No Genetic Screening: Normal Maternal Ultrasounds/Referrals: Abnormal:  Findings:   Isolated EIF (echogenic intracardiac focus) Fetal Ultrasounds or other Referrals:  None Maternal Substance Abuse:  No Significant Maternal Medications:  None  Significant Maternal Lab Results: Lab values include: Group B Strep positive     No results found for this or any previous visit (from the past 24 hour(s)).  Assessment: Karen Christensen is a 23 y.o. G1P0 at 8321w6d here for contractions found not to be in labor but with a hx of gHTN and now non-reassuring fetal status with a BPP of 4/8.     #Labor: likely start with cervical ripening and cytotec.  Then progress to FB #gHTN: check labs and prot/cr ratio #Pain: None currently #FWB: Cat II, EFW 7lb #ID:  GBS pos- start PCN #MOF: breast  #MOC: undecided #Circ:  na  BECK, KELI  L 08/03/2013, 12:15 AM

## 2013-08-03 NOTE — Progress Notes (Signed)
Karen Christensen is a 23 y.o. G1P0 at 6880w6d by  admitted for induction of labor due to NRFHT and 4/8 BPP.   Subjective: More comfortable with epidural  Objective: BP 136/52  Pulse 78  Temp(Src) 97.9 F (36.6 C) (Oral)  Resp 20  Ht 5\' 2"  (1.575 m)  Wt 128.912 kg (284 lb 3.2 oz)  BMI 51.97 kg/m2  SpO2 100%  LMP 11/11/2012      FHT:  FHR: 125 bpm, variability: moderate,  accelerations:  Present,  decelerations:  Absent UC:  Regular every 518m SVE:   Dilation: 5 Effacement (%): 80 Station: -2 Exam by:: Dr. Ike Benedom  Labs: Lab Results  Component Value Date   WBC 9.0 08/03/2013   HGB 10.3* 08/03/2013   HCT 32.0* 08/03/2013   MCV 81.0 08/03/2013   PLT 299 08/03/2013    Assessment / Plan:  IOL due to NRFHT and 4/8 BPP  Labor: s/p FB @ 1840, observed with ctx, no change, will add pit to induction Fetal Wellbeing:  Category I Pain Control: Epidural  I/D:  GBS +, PCN  Anticipated MOD:  NSVD  ODOM, MICHAEL RYAN 08/03/2013, 11:19 PM

## 2013-08-03 NOTE — Progress Notes (Signed)
Karen Christensen is a 23 y.o. G1P0 at 1538w6d admitted for induction of labor due to NRFHT and 4/8 BPP.  Subjective: Feeling some ctx but comfortable.   Objective: BP 122/79  Pulse 77  Temp(Src) 98.1 F (36.7 C) (Oral)  Resp 18  Ht 5\' 2"  (1.575 m)  Wt 128.912 kg (284 lb 3.2 oz)  BMI 51.97 kg/m2  SpO2 100%  LMP 11/11/2012      FHT:  FHR: 135 bpm, variability: moderate,  accelerations:  Present,  decelerations:  Absent UC:   irregular, every 3.5 minutes SVE:   Dilation: 1.5 Effacement (%): 50 Station: -3 Exam by:: Dr Reola CalkinsBeck  Labs: Lab Results  Component Value Date   WBC 8.0 08/03/2013   HGB 9.9* 08/03/2013   HCT 30.4* 08/03/2013   MCV 80.4 08/03/2013   PLT 294 08/03/2013    Assessment / Plan: IOL due to NRFHT and 4/8 BPP   Labor: FB placed, then pit Fetal Wellbeing:  Category I Pain Control:  Fentanyl PRN I/D:  GBS +, PCN Anticipated MOD:  NSVD  Myra RudeSchmitz, Jeremy E 08/03/2013, 10:13 AM

## 2013-08-03 NOTE — Anesthesia Preprocedure Evaluation (Signed)
Anesthesia Evaluation  Patient identified by MRN, date of birth, ID band Patient awake    Reviewed: Allergy & Precautions, H&P , NPO status , Patient's Chart, lab work & pertinent test results  Airway Mallampati: III TM Distance: >3 FB Neck ROM: full    Dental no notable dental hx.    Pulmonary neg pulmonary ROS,    Pulmonary exam normal       Cardiovascular     Neuro/Psych negative neurological ROS  negative psych ROS   GI/Hepatic negative GI ROS, Neg liver ROS,   Endo/Other  Morbid obesity  Renal/GU negative Renal ROS     Musculoskeletal   Abdominal (+) + obese,   Peds  Hematology negative hematology ROS (+)   Anesthesia Other Findings   Reproductive/Obstetrics (+) Pregnancy                           Anesthesia Physical Anesthesia Plan  ASA: III  Anesthesia Plan: Epidural   Post-op Pain Management:    Induction:   Airway Management Planned:   Additional Equipment:   Intra-op Plan:   Post-operative Plan:   Informed Consent: I have reviewed the patients History and Physical, chart, labs and discussed the procedure including the risks, benefits and alternatives for the proposed anesthesia with the patient or authorized representative who has indicated his/her understanding and acceptance.     Plan Discussed with:   Anesthesia Plan Comments:         Anesthesia Quick Evaluation

## 2013-08-03 NOTE — Progress Notes (Signed)
Binnie Kandeirra McKoy is a 23 y.o. G1P0 at 3174w6d admitted for induction of labor due to NRFHT and 4/8 BPP.   Subjective: Feeling pain   Objective: BP 106/56  Pulse 66  Temp(Src) 97.9 F (36.6 C) (Oral)  Resp 20  Ht 5\' 2"  (1.575 m)  Wt 128.912 kg (284 lb 3.2 oz)  BMI 51.97 kg/m2  SpO2 100%  LMP 11/11/2012      FHT:  FHR: 125 bpm, variability: moderate,  accelerations:  Present,  decelerations:  Absent UC:   irregular, every 5 minutes SVE:   Dilation: 3 Effacement (%): 70 Station: -3 Exam by:: Dr. Ike Benedom  Labs: Lab Results  Component Value Date   WBC 8.0 08/03/2013   HGB 9.9* 08/03/2013   HCT 30.4* 08/03/2013   MCV 80.4 08/03/2013   PLT 294 08/03/2013    Assessment / Plan: IOL due to NRFHT and 4/8 BPP   Labor: FB in place then pit Fetal Wellbeing:  Category I Pain Control:  Fentanyl PRN, planning Epidural  I/D:  GBS +, PCN  Anticipated MOD:  NSVD  Myra RudeSchmitz, Jeremy E 08/03/2013, 1:31 PM

## 2013-08-03 NOTE — Anesthesia Procedure Notes (Signed)
Epidural Patient location during procedure: OB Start time: 08/03/2013 9:52 PM End time: 08/03/2013 9:56 PM  Staffing Anesthesiologist: Leilani AbleHATCHETT, FRANKLIN Performed by: anesthesiologist   Preanesthetic Checklist Completed: patient identified, surgical consent, pre-op evaluation, timeout performed, IV checked, risks and benefits discussed and monitors and equipment checked  Epidural Patient position: sitting Prep: site prepped and draped and DuraPrep Patient monitoring: continuous pulse ox and blood pressure Approach: midline Location: L3-L4 Injection technique: LOR air  Needle:  Needle type: Tuohy  Needle gauge: 17 G Needle length: 9 cm and 9 Needle insertion depth: 7 cm Catheter type: closed end flexible Catheter size: 19 Gauge Catheter at skin depth: 12 cm Test dose: negative and Other  Assessment Events: blood not aspirated, injection not painful, no injection resistance, negative IV test and no paresthesia  Additional Notes Reason for block:procedure for pain

## 2013-08-03 NOTE — Progress Notes (Signed)
Introduced myself to patient. Insummary, IOL for NRFHT and 4/8 BPP from last night. Currently fetus is reassuring but has not had recent accelerations. Discussed plan with patient to include continued IOL with FB or cytotec. Also discussed possibility of emergent C-section based on fetal status. Pt states understanding.  Tawana ScaleMichael Ryan Odom, MD OB Fellow

## 2013-08-03 NOTE — H&P (Signed)
Attestation of Attending Supervision of Fellow: Evaluation and management procedures were performed by the Fellow under my supervision and collaboration.  I have reviewed the Fellow's note and chart, and I agree with the management and plan.    

## 2013-08-03 NOTE — Progress Notes (Signed)
Karen Christensen is a 23 y.o. G1P0 at 6773w6d admitted for induction of labor [facility] and nonreassuring fetal status.  Subjective:  Comfortable. Having some contractions but not severe. +FM   Objective: BP 122/79  Pulse 77  Temp(Src) 98.1 F (36.7 C) (Oral)  Resp 18  Ht 5\' 2"  (1.575 m)  Wt 128.912 kg (284 lb 3.2 oz)  BMI 51.97 kg/m2  SpO2 100%  LMP 11/11/2012      FHT:  FHR: 125 bpm, variability: moderate,  accelerations:  Present,  decelerations:  Absent UC:   regular, every 1.5-3 minutes SVE:   Dilation: 1.5 Effacement (%): 50 Station: -3 Exam by:: Dr Reola CalkinsBeck  Labs: Lab Results  Component Value Date   WBC 8.0 08/03/2013   HGB 9.9* 08/03/2013   HCT 30.4* 08/03/2013   MCV 80.4 08/03/2013   PLT 294 08/03/2013    Assessment / Plan: IOL due to nonreassuring fetal status and gHTN per chart  Labor: s/p cytotec x 2. up at 10am. doing well.  likely good fb candidate at 10 Fetal Wellbeing:  Category I Pain Control:  Labor support without medications I/D:  gbs pos, on PCN Anticipated MOD:  NSVD  BECK, KELI L 08/03/2013, 8:24 AM

## 2013-08-04 ENCOUNTER — Encounter (HOSPITAL_COMMUNITY): Payer: Self-pay | Admitting: Obstetrics

## 2013-08-04 DIAGNOSIS — O368190 Decreased fetal movements, unspecified trimester, not applicable or unspecified: Secondary | ICD-10-CM

## 2013-08-04 DIAGNOSIS — O139 Gestational [pregnancy-induced] hypertension without significant proteinuria, unspecified trimester: Secondary | ICD-10-CM

## 2013-08-04 DIAGNOSIS — Z6841 Body Mass Index (BMI) 40.0 and over, adult: Secondary | ICD-10-CM

## 2013-08-04 MED ORDER — SENNOSIDES-DOCUSATE SODIUM 8.6-50 MG PO TABS
2.0000 | ORAL_TABLET | ORAL | Status: DC
  Administered 2013-08-04: 2 via ORAL
  Filled 2013-08-04: qty 2

## 2013-08-04 MED ORDER — TETANUS-DIPHTH-ACELL PERTUSSIS 5-2.5-18.5 LF-MCG/0.5 IM SUSP: 0.5000 mL | Freq: Once | INTRAMUSCULAR | Status: DC

## 2013-08-04 MED ORDER — ONDANSETRON HCL 4 MG PO TABS: 4.0000 mg | ORAL_TABLET | ORAL | Status: DC | PRN

## 2013-08-04 MED ORDER — OXYCODONE-ACETAMINOPHEN 5-325 MG PO TABS
1.0000 | ORAL_TABLET | ORAL | Status: DC | PRN
  Administered 2013-08-05: 1 via ORAL
  Filled 2013-08-04: qty 1

## 2013-08-04 MED ORDER — BENZOCAINE-MENTHOL 20-0.5 % EX AERO
1.0000 "application " | INHALATION_SPRAY | CUTANEOUS | Status: DC | PRN
  Administered 2013-08-04: 1 via TOPICAL
  Filled 2013-08-04: qty 56

## 2013-08-04 MED ORDER — ZOLPIDEM TARTRATE 5 MG PO TABS: 5.0000 mg | ORAL_TABLET | Freq: Every evening | ORAL | Status: DC | PRN

## 2013-08-04 MED ORDER — DIBUCAINE 1 % RE OINT: 1.0000 "application " | TOPICAL_OINTMENT | RECTAL | Status: DC | PRN

## 2013-08-04 MED ORDER — DIPHENHYDRAMINE HCL 25 MG PO CAPS
25.0000 mg | ORAL_CAPSULE | Freq: Four times a day (QID) | ORAL | Status: DC | PRN
Start: 2013-08-04 — End: 2013-08-05

## 2013-08-04 MED ORDER — ONDANSETRON HCL 4 MG/2ML IJ SOLN: 4.0000 mg | INTRAMUSCULAR | Status: DC | PRN

## 2013-08-04 MED ORDER — WITCH HAZEL-GLYCERIN EX PADS: 1.0000 "application " | MEDICATED_PAD | CUTANEOUS | Status: DC | PRN

## 2013-08-04 MED ORDER — IBUPROFEN 600 MG PO TABS
600.0000 mg | ORAL_TABLET | Freq: Four times a day (QID) | ORAL | Status: DC
  Administered 2013-08-04 – 2013-08-05 (×5): 600 mg via ORAL
  Filled 2013-08-04 (×5): qty 1

## 2013-08-04 MED ORDER — LANOLIN HYDROUS EX OINT: TOPICAL_OINTMENT | CUTANEOUS | Status: DC | PRN

## 2013-08-04 MED ORDER — SIMETHICONE 80 MG PO CHEW: 80.0000 mg | CHEWABLE_TABLET | ORAL | Status: DC | PRN

## 2013-08-04 MED ORDER — PRENATAL MULTIVITAMIN CH
1.0000 | ORAL_TABLET | Freq: Every day | ORAL | Status: DC
  Administered 2013-08-05: 1 via ORAL
  Filled 2013-08-04: qty 1

## 2013-08-04 NOTE — Anesthesia Postprocedure Evaluation (Signed)
  Anesthesia Post-op Note  Patient: Karen Christensen  Procedure(s) Performed: * No procedures listed *  Patient Location: Mother/Baby  Anesthesia Type:Epidural  Level of Consciousness: awake, alert , oriented and patient cooperative  Airway and Oxygen Therapy: Patient Spontanous Breathing  Post-op Pain: mild  Post-op Assessment: Patient's Cardiovascular Status Stable, Respiratory Function Stable, No headache, No backache, No residual numbness and No residual motor weakness  Post-op Vital Signs: stable  Last Vitals:  Filed Vitals:   08/04/13 1020  BP: 106/69  Pulse: 73  Temp: 36.7 C  Resp: 18    Complications: No apparent anesthesia complications

## 2013-08-04 NOTE — Lactation Note (Signed)
This note was copied from the chart of Karen Christensen. Lactation Consultation Note    Initial consult with this mom and term baby, now 759 hours old. Baby did latch within minutes of birth. i assisted mom with cross cradle hold, and baby latched with compression of breast tissue. He needed relatching about 5 times in twenty minutes. Good breast movement noted with strong suckles. Mom asked that feeding end after 20 minutes, due to severe cramping. Mom to get pain meds, and baby placed skin to skin. Dad aware mom can sleep with baby skin to skin, as long as he watches the baby . Lactation services reviewed with mom, ans well as basic breast feeding teaching. Mom knows to call lactation for questions/concerns.  Patient Name: Karen Christensen ZHYQM'VToday's Date: 08/04/2013 Reason for consult: Initial assessment   Maternal Data Formula Feeding for Exclusion: No Infant to breast within first hour of birth: Yes Has patient been taught Hand Expression?: Yes Does the patient have breastfeeding experience prior to this delivery?: No  Feeding Feeding Type: Breast Fed Length of feed: 20 min (about 15 minutes of active sucking)  LATCH Score/Interventions Latch: Repeated attempts needed to sustain latch, nipple held in mouth throughout feeding, stimulation needed to elicit sucking reflex. Intervention(s): Adjust position;Assist with latch;Breast massage;Breast compression  Audible Swallowing: A few with stimulation  Type of Nipple: Everted at rest and after stimulation (thick, dense nipples - baby pinches nipple some)  Comfort (Breast/Nipple): Soft / non-tender     Hold (Positioning): Assistance needed to correctly position infant at breast and maintain latch. Intervention(s): Breastfeeding basics reviewed;Support Pillows;Position options;Skin to skin  LATCH Score: 7  Lactation Tools Discussed/Used     Consult Status Consult Status: Follow-up Date: 08/05/13 Follow-up type:  In-patient    Alfred LevinsLee, Christine Anne 08/04/2013, 2:01 PM

## 2013-08-05 ENCOUNTER — Encounter: Payer: BC Managed Care – PPO | Admitting: Obstetrics and Gynecology

## 2013-08-05 MED ORDER — IBUPROFEN 600 MG PO TABS: 600.0000 mg | ORAL_TABLET | Freq: Four times a day (QID) | ORAL | Status: DC

## 2013-08-05 NOTE — Discharge Instructions (Signed)
Vaginal Delivery °Care After °Refer to this sheet in the next few weeks. These discharge instructions provide you with information on caring for yourself after delivery. Your caregiver may also give you specific instructions. Your treatment has been planned according to the most current medical practices available, but problems sometimes occur. Call your caregiver if you have any problems or questions after you go home. °HOME CARE INSTRUCTIONS °· Take over-the-counter or prescription medicines only as directed by your caregiver or pharmacist. °· Do not drink alcohol, especially if you are breastfeeding or taking medicine to relieve pain. °· Do not chew or smoke tobacco. °· Do not use illegal drugs. °· Continue to use good perineal care. Good perineal care includes: °¨ Wiping your perineum from front to back. °¨ Keeping your perineum clean. °· Do not use tampons or douche until your caregiver says it is okay. °· Shower, wash your hair, and take tub baths as directed by your caregiver. °· Wear a well-fitting bra that provides breast support. °· Eat healthy foods. °· Drink enough fluids to keep your urine clear or pale yellow. °· Eat high-fiber foods such as whole grain cereals and breads, brown rice, beans, and fresh fruits and vegetables every day. These foods may help prevent or relieve constipation. °· Follow your cargiver's recommendations regarding resumption of activities such as climbing stairs, driving, lifting, exercising, or traveling. °· Talk to your caregiver about resuming sexual activities. Resumption of sexual activities is dependent upon your risk of infection, your rate of healing, and your comfort and desire to resume sexual activity. °· Try to have someone help you with your household activities and your newborn for at least a few days after you leave the hospital. °· Rest as much as possible. Try to rest or take a nap when your newborn is sleeping. °· Increase your activities gradually. °· Keep all  of your scheduled postpartum appointments. It is very important to keep your scheduled follow-up appointments. At these appointments, your caregiver will be checking to make sure that you are healing physically and emotionally. °SEEK MEDICAL CARE IF:  °· You are passing large clots from your vagina. Save any clots to show your caregiver. °· You have a foul smelling discharge from your vagina. °· You have trouble urinating. °· You are urinating frequently. °· You have pain when you urinate. °· You have a change in your bowel movements. °· You have increasing redness, pain, or swelling near your vaginal incision (episiotomy) or vaginal tear. °· You have pus draining from your episiotomy or vaginal tear. °· Your episiotomy or vaginal tear is separating. °· You have painful, hard, or reddened breasts. °· You have a severe headache. °· You have blurred vision or see spots. °· You feel sad or depressed. °· You have thoughts of hurting yourself or your newborn. °· You have questions about your care, the care of your newborn, or medicines. °· You are dizzy or lightheaded. °· You have a rash. °· You have nausea or vomiting. °· You were breastfeeding and have not had a menstrual period within 12 weeks after you stopped breastfeeding. °· You are not breastfeeding and have not had a menstrual period by the 12th week after delivery. °· You have a fever. °SEEK IMMEDIATE MEDICAL CARE IF:  °· You have persistent pain. °· You have chest pain. °· You have shortness of breath. °· You faint. °· You have leg pain. °· You have stomach pain. °· Your vaginal bleeding saturates two or more sanitary pads   in 1 hour. °MAKE SURE YOU:  °· Understand these instructions. °· Will watch your condition. °· Will get help right away if you are not doing well or get worse. ° ° °Document Released: 02/02/2000 Document Revised: 10/30/2011 Document Reviewed: 10/02/2011 °ExitCare® Patient Information ©2015 [employer]. This information is not intended to  replace advice given to you by your health care provider. Make sure you discuss any questions you have with your health care provider. ° °

## 2013-08-05 NOTE — Discharge Summary (Signed)
Obstetric Discharge Summary Reason for Admission: onset of labor Prenatal Procedures: none Intrapartum Procedures: spontaneous vaginal delivery Postpartum Procedures: none Complications-Operative and Postpartum: none Hemoglobin  Date Value Ref Range Status  08/03/2013 10.3* 12.0 - 15.0 g/dL Final     HCT  Date Value Ref Range Status  08/03/2013 32.0* 36.0 - 46.0 % Final   Hospital Course: Pt presents to MAU 6/15 at 9:46 PM. Had a watery discharge but has not noticed it since earlier that day. Denies vag bleeding. +FM.   LABOR ADMISSION HISTORY AND PHYSICAL  Karen Christensen is a 23 y.o. female G1P0 with IUP at 4842w6d presenting for r/o labor. States had regular contractions but spaced out by time of exam. While in MAU, NST with wandering baseline and unclear whether she is having accelerations or decelerations. Given this and report of decreased FM, BPP was ordered. BPP of 4/8 (2 off for breathing and tone) with at the end of her tracing a reactive tracing making it 6/10. Also with diagnosis of gHTN so at this time, will admit and induce for gHTN and NR fetal status.  [facility]. Complicated by obesity. GHTN with elevated bp at 25 weeks. EIF on US but otherwise normal. GBS positive.   Review of Systems  All systems reviewed and negative except as stated in HPI  Blood pressure 113/60, pulse 98, temperature 97.8 F (36.6 C), temperature source Oral, resp. rate 18, height 5\' 2"  (1.575 m), weight 128.912 kg (284 lb 3.2 oz), last menstrual period 11/11/2012, SpO2 100.00%.  General appearance: alert, cooperative and no distress  Lungs: clear to auscultation bilaterally  Heart: regular rate and rhythm  Abdomen: soft, non-tender; bowel sounds normal  Extremities: Homans sign is negative, no sign of DVT  DTR's 2+   Presentation: cephalic  Fetal monitoringBaseline: 135, 140 bpm, Variability: Good {> 6 bpm), Accelerations: not reactive and Decelerations: ?variable decels  Uterine  activity irregular, occasional  Dilation: 1  Effacement (%): 50  Station: Ballotable  Exam by:: Elonda Husky. Robinson RN   Prenatal labs:  ABO, Rh: --/--/B POS, B POS (04/29 16100512)  Antibody: NEG (04/29 0512)  Rubella:  RPR: NON REAC (04/02 1017)  HBsAg: NEGATIVE (11/25 1059)  HIV: NON REACTIVE (04/02 1017)  GBS:  1 hr Glucola 128  Genetic screening normal  Anatomy US normal.   Delivery Note  At 3:40 AM a viable female was delivered via Vaginal, Spontaneous Delivery (Presentation: Left Occiput Anterior). APGAR: 7, ; weight 6 lb 10.2 oz (3010 g).  Placenta status: Intact, Spontaneous. Cord: 3 vessels with the following complications: None. Cord pH: NA  Anesthesia: Epidural  Episiotomy:  Lacerations: 2nd degree  Suture Repair: 3.0 monocryl on CT  Est. Blood Loss (mL): 250  Mom to postpartum. Baby to Couplet care / Skin to Skin.   Called to delivery. Mother pushed over 2nd degree tear. I arrived after Infant delivered to maternal abdomen. Cord clamped and cut. Active management of 3rd stage with traction and Pitocin started after I delivered Placenta intact with 3v cord. Tear repaired with 3.0 monocryl on CT in usual manner. [id number]. Counts correct. Hemostatic.  Karen ScaleMichael Ryan Odom, MD  OB Fellow  08/04/2013, 4:09 AM   Physical Exam:  General: alert, cooperative and no distress Lochia: appropriate Uterine Fundus: firm Incision: N/A DVT Evaluation: No evidence of DVT seen on physical exam. No cords or calf tenderness. No significant calf/ankle edema.  Discharge Diagnoses: Term Pregnancy-delivered  Discharge Information: Date: 08/05/2013 Activity: unrestricted Diet: routine  Medications: None Condition: stable Instructions: refer to practice specific booklet Discharge to: home   Newborn Data: Live born female  Birth Weight: 6 lb 10.2 oz (3011 g) APGAR: 7, 9  Home with mother.  Bing Christensen, Karen E 08/05/2013, 8:02 AM  I have seen and examined this patient and I agree with the  above. Breastfeeding and desires Depo for contraception. [facility]. Karen Christensen, Karen Christensen 8:48 AM 08/05/2013

## 2013-08-05 NOTE — Discharge Summary (Signed)
Attestation of Attending Supervision of Advanced Practitioner: Evaluation and management procedures were performed by the PA/NP/CNM/OB Fellow under my supervision/collaboration. Chart reviewed and agree with management and plan.  FERGUSON,JOHN V 08/05/2013 1:30 PM

## 2013-08-05 NOTE — Progress Notes (Signed)
Post Partum Day 1 Subjective: no complaints, up ad lib, voiding, tolerating PO, + flatus and last BM this morning  Objective: Blood pressure 120/77, pulse 80, temperature 97.8 F (36.6 C), temperature source Oral, resp. rate 20, height 5\' 2"  (1.575 m), weight 128.912 kg (284 lb 3.2 oz), last menstrual period 11/11/2012, SpO2 100.00%, unknown if currently breastfeeding.  Physical Exam:  General: alert, cooperative and no distress Lochia: appropriate Uterine Fundus: firm but difficult to palpate based on patient size Incision: N/A DVT Evaluation: No evidence of DVT seen on physical exam. No cords or calf tenderness. No significant calf/ankle edema.   Recent Labs  08/03/13 0040 08/03/13 1953  HGB 9.9* 10.3*  HCT 30.4* 32.0*    Assessment/Plan: Pt would like to go home today Breastfeeding Discussed MOC: Depo shot   LOS: 3 days   Bing PlumeGervasi, Kristin E 08/05/2013, 7:40 AM   I have seen and examined this patient and I agree with the above. See Discharge Summary.  Cam HaiSHAW, KIMBERLY CNM 8:55 AM 08/05/2013

## 2013-08-05 NOTE — Lactation Note (Addendum)
This note was copied from the chart of Karen Christensen. Lactation Consultation Note  Patient Name: Karen Christensen ZOXWR'UToday's Date: 08/05/2013 Reason for consult: Follow-up assessment Baby 30 hours of life. Mom reports that when baby latches well, she hears gulps, but baby often difficult to latch. Mom reports baby gets fussy if can't latch quickly. Mom able to hand express and has drops of colostrum. Baby refuses to latch, is sleepy. Attempted waking techniques. LC provided suck training with gloved finger, but baby chomps with gums. Enc mom to massage breasts and hand express. Baby given 3 mls of EBM with a spoon, to wake and enc to breastfeed. Baby still sleepy, not interested in nursing. Mom reports baby having lots of diapers so far. Mom requested and given a hand pump with instructions and enc to postpump after offering breast if baby does not latch well. Mom enc to continue to offer lots of STS and attempt to latch often. Enc mom to call for assistance as needed. FOB assisted with spoon-feeding. Referred mom to Baby and Me booklet for number of diapers to expect and EBM storage. Enc mom to feed with cues and at least 8-12 times/24 hours. Enc mom to ask for assistance to make sure getting a deep latch. [facility] for additional help as needed.  Maternal Data    Feeding Feeding Type: Breast Fed Length of feed: 0 min  LATCH Score/Interventions Latch: Too sleepy or reluctant, no latch achieved, no sucking elicited. Intervention(s): Skin to skin;Teach feeding cues;Waking techniques Intervention(s): Adjust position;Assist with latch;Breast massage;Breast compression  Audible Swallowing: None Intervention(s): Skin to skin  Type of Nipple: Everted at rest and after stimulation  Comfort (Breast/Nipple): Soft / non-tender     Hold (Positioning): No assistance needed to correctly position infant at breast. Intervention(s): Support Pillows;Breastfeeding basics  reviewed  LATCH Score: 6  Lactation Tools Discussed/Used Tools: Pump Breast pump type: Manual   Consult Status Consult Status: Follow-up Follow-up type: In-patient    Geralynn OchsWILLIARD, JENNIFER 08/05/2013, 10:17 AM

## 2013-08-06 ENCOUNTER — Ambulatory Visit: Payer: Self-pay

## 2013-08-06 NOTE — Lactation Note (Signed)
This note was copied from the chart of Karen Christensen. Lactation Consultation Note: Follow up visit with mom before DC. Mom reports that baby has been nursing well and breasts are feeling a little fuller this morning. No questions at present. To call prn  Patient Name: Karen Christensen [id number]'WToday's Date: 08/06/2013 Reason for consult: Follow-up assessment   Maternal Data    Feeding   LATCH Score/Interventions                      Lactation Tools Discussed/Used     Consult Status Consult Status: Complete    [name], Donna D 08/06/2013, 8:21 AM

## 2013-08-17 ENCOUNTER — Encounter: Payer: Self-pay | Admitting: Obstetrics & Gynecology

## 2013-08-18 ENCOUNTER — Encounter: Payer: BC Managed Care – PPO | Admitting: Obstetrics and Gynecology

## 2013-08-25 ENCOUNTER — Ambulatory Visit: Payer: BC Managed Care – PPO | Admitting: Obstetrics & Gynecology

## 2013-09-02 ENCOUNTER — Ambulatory Visit (INDEPENDENT_AMBULATORY_CARE_PROVIDER_SITE_OTHER): Payer: BC Managed Care – PPO | Admitting: Obstetrics & Gynecology

## 2013-09-02 ENCOUNTER — Encounter: Payer: Self-pay | Admitting: Obstetrics & Gynecology

## 2013-09-02 VITALS — BP 122/86 | HR 87 | Temp 97.2°F | Ht 63.0 in | Wt 248.9 lb

## 2013-09-02 DIAGNOSIS — Z30011 Encounter for initial prescription of contraceptive pills: Secondary | ICD-10-CM

## 2013-09-02 DIAGNOSIS — Z3009 Encounter for other general counseling and advice on contraception: Secondary | ICD-10-CM

## 2013-09-02 MED ORDER — NORGESTIMATE-ETH ESTRADIOL 0.25-35 MG-MCG PO TABS: 1.0000 | ORAL_TABLET | Freq: Every day | ORAL | Status: DC

## 2013-09-02 NOTE — Progress Notes (Signed)
    Subjective:     Karen Christensen is a 23 y.o. 381P1001 female who presents for a postpartum visit. She is 4 weeks postpartum following a spontaneous vaginal delivery. I have fully reviewed the prenatal and intrapartum course. The delivery was at 38 gestational weeks after IOL for BPP 4/10.  Anesthesia: epidural. Postpartum course has been uncomplicated. Baby's course has been uncomplicated. Baby is feeding by bottle. Bleeding no bleeding. Bowel function is normal. Bladder function is normal. Patient is not sexually active. Contraception method is OCP (estrogen/progesterone). Postpartum depression screening: negative.  The following portions of the patient's history were reviewed and updated as appropriate: allergies, current medications, past family history, past medical history, past social history, past surgical history and problem list.  Normal pap on 01/12/13.  Review of Systems A comprehensive review of systems was negative.   Objective:    BP 122/86  Pulse 87  Temp(Src) 97.2 F (36.2 C) (Oral)  Ht 5\' 3"  (1.6 m)  Wt 248 lb 14.4 oz (112.9 kg)  BMI 44.10 kg/m2  Breastfeeding? No  General:  alert and no distress   Breasts:  deferred  Lungs: clear to auscultation bilaterally  Heart:  regular rate and rhythm  Abdomen: soft, non-tender; bowel sounds normal; no masses,  no organomegaly, obese habitus   Vulva:  normal  Vagina: normal vagina, no discharge, exudate, lesion, or erythema  Cervix:  multiparous appearance  Corpus: normal size, contour, position, consistency, mobility, non-tender  Adnexa:  normal adnexa  Rectal Exam: Not performed.        Assessment:   Normal postpartum exam. Pap smear not done at today's visit.   Plan:   1. Contraception: OCP (estrogen/progesterone); Sprintec prescribed. BP check in 2 months. 2. Follow up as needed.    Karen CollinsUGONNA  Henlee Donovan, MD, FACOG Attending Obstetrician & Gynecologist Center for [facility], [facility]

## 2013-09-10 ENCOUNTER — Encounter: Payer: Self-pay | Admitting: General Practice

## 2013-12-20 ENCOUNTER — Encounter: Payer: Self-pay | Admitting: Obstetrics & Gynecology

## 2014-02-18 ENCOUNTER — Emergency Department (HOSPITAL_COMMUNITY)
Admission: EM | Admit: 2014-02-18 | Discharge: 2014-02-18 | Disposition: A | Discharge status: Home / Self Care | Payer: Medicaid Other | Source: Home / Self Care | Attending: Family Medicine | Admitting: Family Medicine

## 2014-02-18 ENCOUNTER — Encounter (HOSPITAL_COMMUNITY): Payer: Self-pay | Admitting: *Deleted

## 2014-02-18 DIAGNOSIS — J069 Acute upper respiratory infection, unspecified: Secondary | ICD-10-CM

## 2014-02-18 LAB — POCT RAPID STREP A: Streptococcus, Group A Screen (Direct): NEGATIVE

## 2014-02-18 MED ORDER — IPRATROPIUM BROMIDE 0.06 % NA SOLN: 2.0000 | Freq: Four times a day (QID) | NASAL | Status: DC

## 2014-02-18 NOTE — ED Provider Notes (Signed)
CSN: [id number]     Arrival date & time 02/18/14  1717 History   First MD Initiated Contact with Patient 02/18/14 1720     Chief Complaint  Patient presents with  . Sore Throat   (Consider location/radiation/quality/duration/timing/severity/associated sxs/prior Treatment) Patient is a 24 y.o. female presenting with pharyngitis. The history is provided by the patient.  Sore Throat This is a new problem. The current episode started 2 days ago. The problem has not changed since onset.Pertinent negatives include no chest pain, no abdominal pain, no headaches and no shortness of breath. Associated symptoms comments: Glenford Peers and assoc sx . The symptoms are aggravated by swallowing.    Past Medical History  Diagnosis Date  . Medical history non-contributory    Past Surgical History  Procedure Laterality Date  . No past surgeries     Family History  Problem Relation Age of Onset  . Asthma Sister   . Cancer Maternal Grandmother   . Cancer Paternal Grandmother   . Stroke Neg Hx   . Heart disease Neg Hx    History  Substance Use Topics  . Smoking status: Never Smoker   . Smokeless tobacco: Never Used  . Alcohol Use: No   OB History    Gravida Para Term Preterm AB TAB SAB Ectopic Multiple Living   Review of Systems  Constitutional: Negative for fever, chills, activity change and appetite change.  HENT: Positive for congestion, postnasal drip and rhinorrhea.   Respiratory: Positive for cough. Negative for shortness of breath.   Cardiovascular: Negative for chest pain.  Gastrointestinal: Negative.  Negative for abdominal pain.  Genitourinary: Negative.   Neurological: Negative for headaches.    Allergies  Review of patient's allergies indicates no known allergies.  Home Medications   Prior to Admission medications   Medication Sig Start Date End Date Taking? Authorizing Provider  ipratropium (ATROVENT) 0.06 % nasal spray Place 2 sprays into both nostrils 4  (four) times daily. 02/18/14   Linna Hoff, MD  norgestimate-ethinyl estradiol (ORTHO-CYCLEN,SPRINTEC,PREVIFEM) 0.25-35 MG-MCG tablet Take 1 tablet by mouth daily. 09/02/13   Tereso Newcomer, MD  Prenatal Vit-Fe Fumarate-FA (PRENATAL MULTIVITAMIN) TABS tablet Take 1 tablet by mouth daily at 12 noon.    Historical Provider, MD   BP 124/86 mmHg  Pulse 72  Temp(Src) 98.6 F (37 C) (Oral)  Resp 18  SpO2 100%  LMP 02/18/2014 Physical Exam  Constitutional: She is oriented to person, place, and time. She appears well-developed and well-nourished. No distress.  HENT:  Head: Normocephalic.  Right Ear: External ear normal.  Left Ear: External ear normal.  Nose: Nose normal.  Mouth/Throat: Oropharynx is clear and moist.  Eyes: Conjunctivae are normal. Pupils are equal, round, and reactive to light.  Neck: Normal range of motion. Neck supple.  Cardiovascular: Normal rate, regular rhythm, normal heart sounds and intact distal pulses.   Pulmonary/Chest: Effort normal and breath sounds normal.  Lymphadenopathy:    She has no cervical adenopathy.  Neurological: She is alert and oriented to person, place, and time.  Skin: Skin is warm and dry.  Nursing note and vitals reviewed.   ED Course  Procedures (including critical care time) Labs Review Labs Reviewed  POCT RAPID STREP A ([facility] CARE ONLY)   Strep neg. Imaging Review No results found.   MDM   1. URI (upper respiratory infection)        Linna Hoff,  MD 02/18/14 1809

## 2014-02-18 NOTE — ED Notes (Signed)
Pt      Reports    Symptoms    Of  sorethroat       With        Hoarseness   With  Onset   Of  Symptoms  Yesterday      Pt is pleasant  Speaking in  Complete  sentances  And  Is in no acute  Distress      Skin is  Warm and  dry

## 2014-02-20 LAB — CULTURE, GROUP A STREP

## 2014-02-20 NOTE — ED Notes (Signed)
Final report of group A&B strep was negative 

## 2014-08-15 IMAGING — US US OB FOLLOW-UP
1 series · 12 of 28 positions shown · non-contrast
Comparison: none

[Series 1: us ob follow up · 47 acquisitions, 12 frames shown]
[im 2/47]
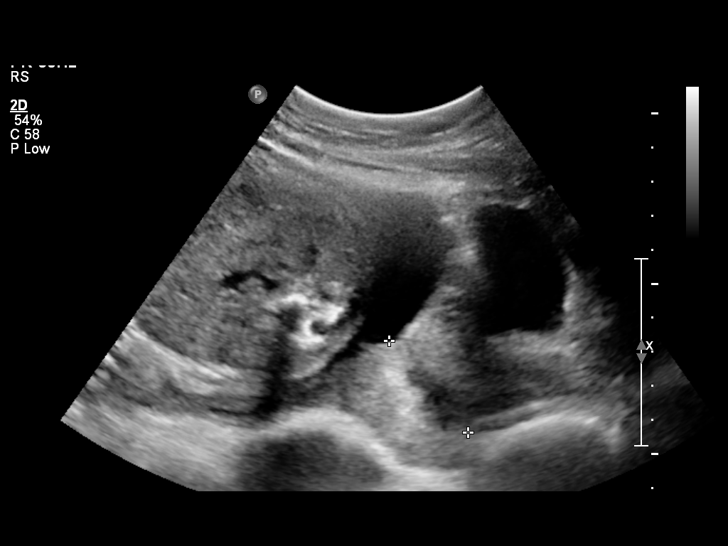
[im 6/47]
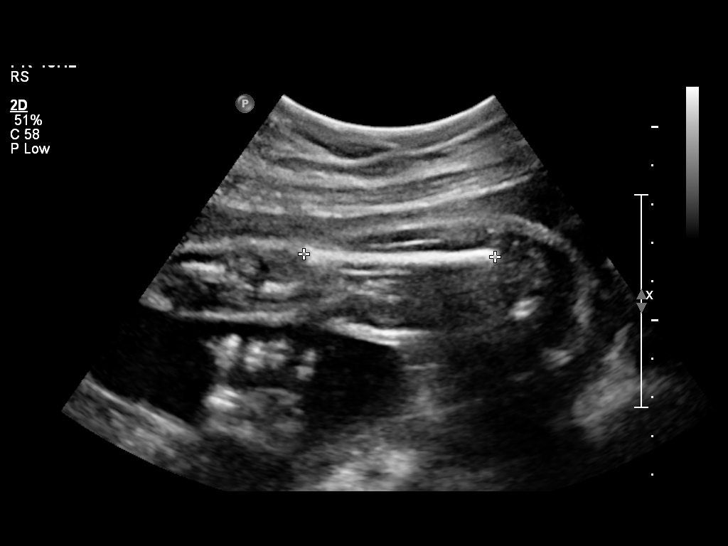
[im 9/47]
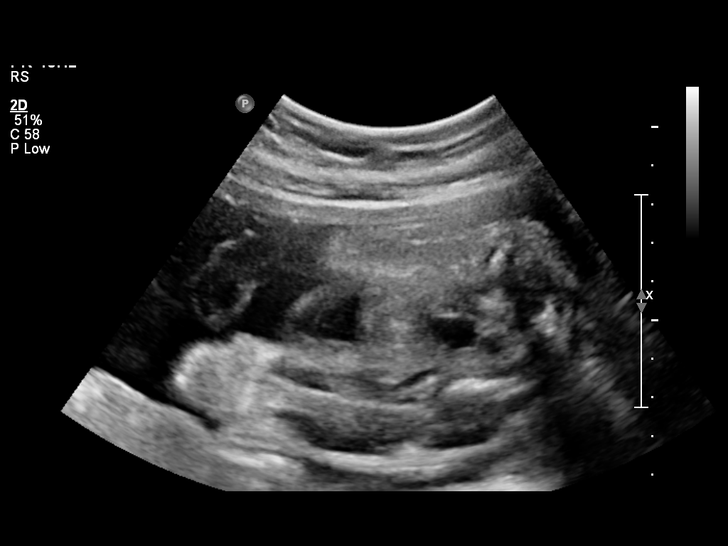
[im 14/47]
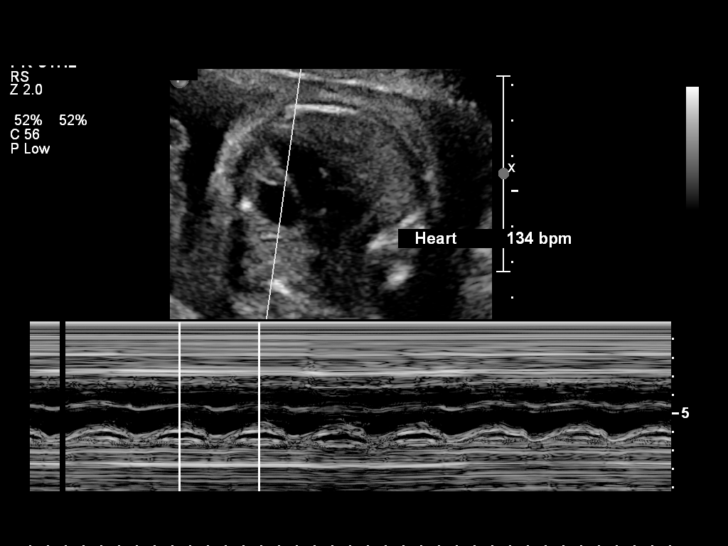
[im 18/47]
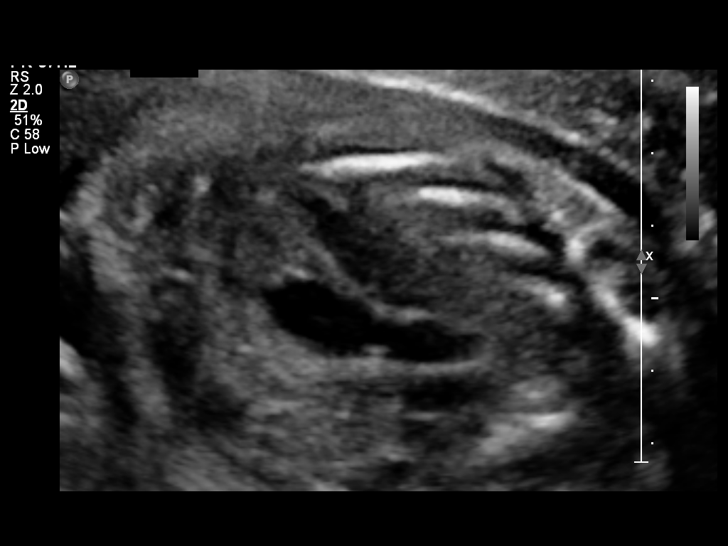
[im 21/47]
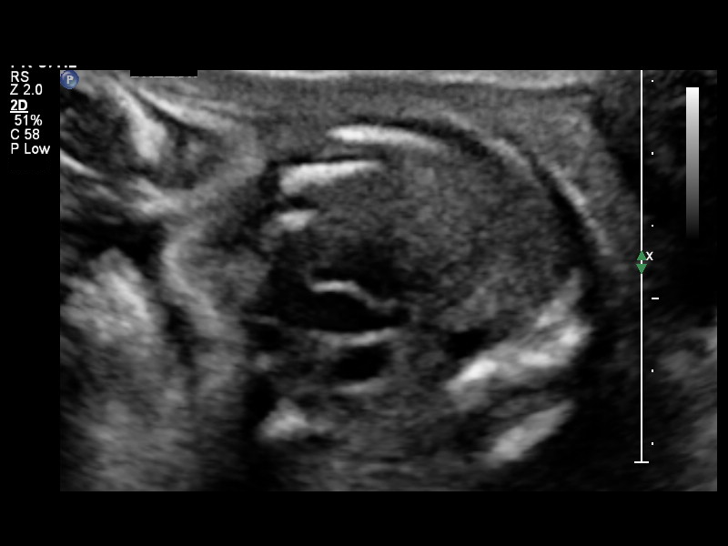
[im 26/47]
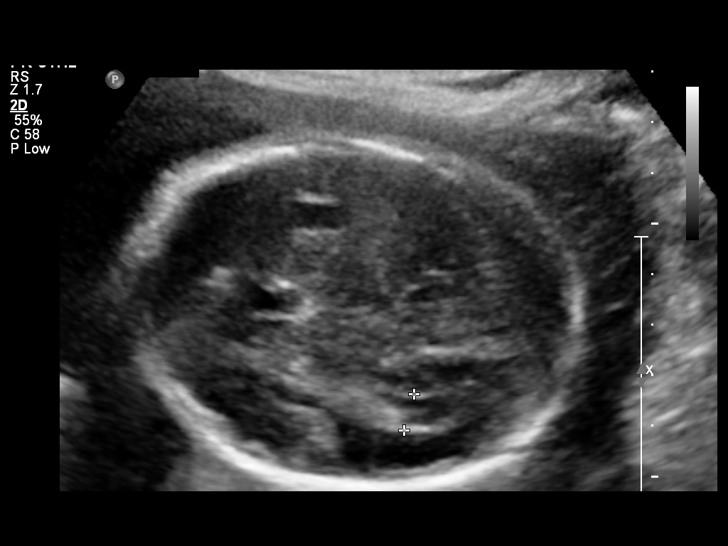
[im 29/47]
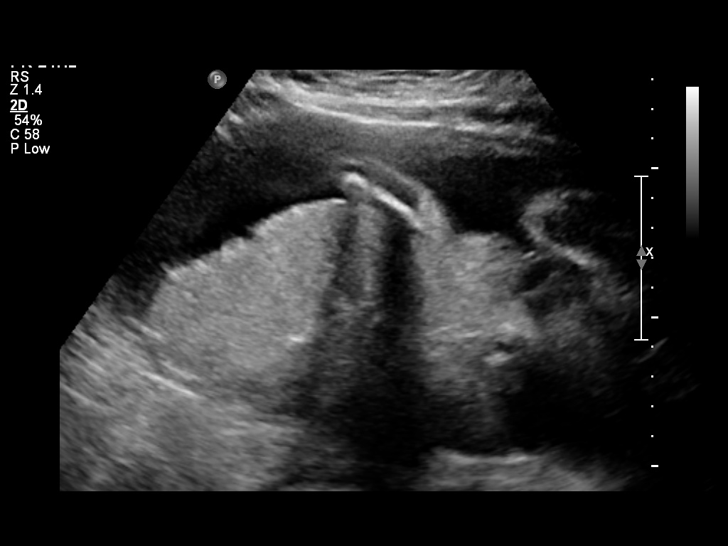
[im 33/47]
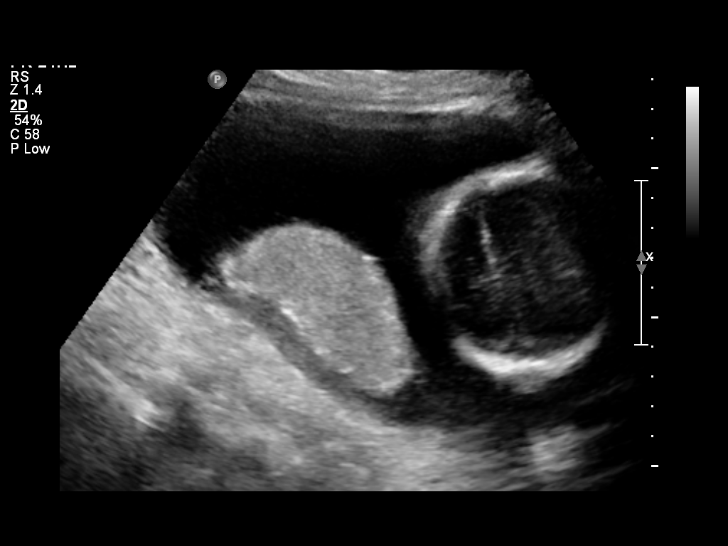
[im 38/47]
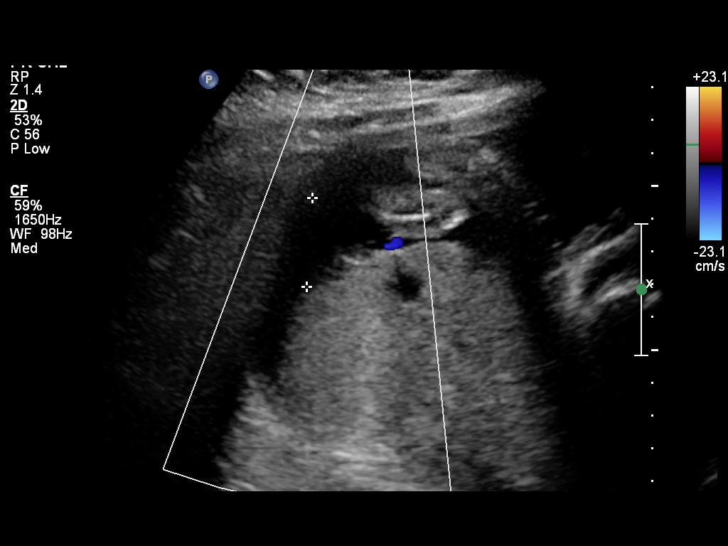
[im 41/47]
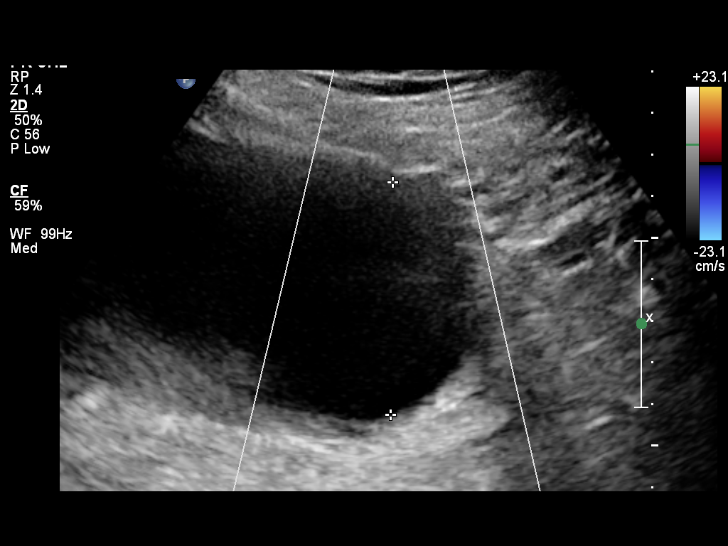
[im 45/47]
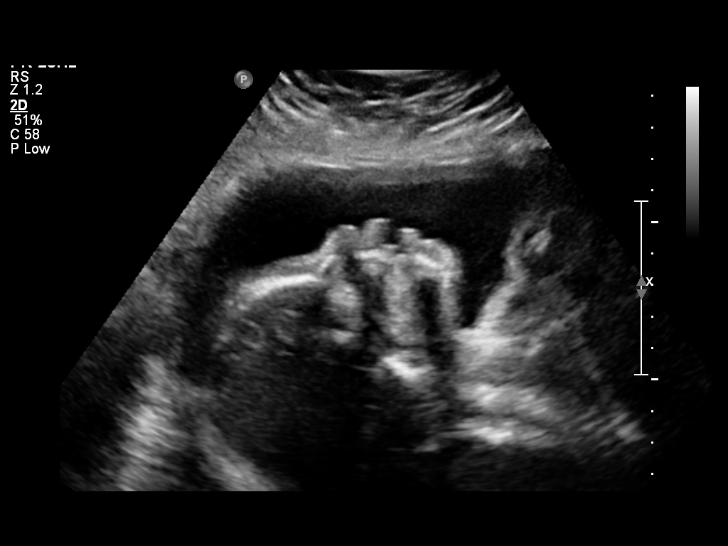

[12 of 28 positions shown; findings below may reference images not displayed]

OBSTETRICS REPORT
                      (Signed Final 05/26/2013 [DATE])

Service(s) Provided

 US OB FOLLOW UP                                       76816.1
Indications

 Echogenic intracardiac focus of the heart  (EIF)Left
 ventricle on previous ultrasound
 Hypertension - Gestational
Fetal Evaluation

 Num Of Fetuses:    1
 Fetal Heart Rate:  134                          bpm
 Cardiac Activity:  Observed
 Presentation:      Breech
 Placenta:          Posterior, above cervical
                    os
 P. Cord            Visualized, central
 Insertion:

 Amniotic Fluid
 AFI FV:      Subjectively within normal limits
 AFI Sum:     13.98   cm       45  %Tile     Larg Pckt:    5.56  cm
 RUQ:   2.7     cm   RLQ:    3.32   cm    LUQ:   5.56    cm   LLQ:    2.4    cm
Biometry

 BPD:     67.5  mm     G. Age:  27w 1d                CI:        74.88   70 - 86
                                                      FL/HC:      20.0   18.8 -

 HC:     247.5  mm     G. Age:  26w 6d        3  %    HC/AC:      1.06   1.05 -

 AC:       233  mm     G. Age:  27w 4d       32  %    FL/BPD:     73.5   71 - 87
 FL:      49.6  mm     G. Age:  26w 5d        8  %    FL/AC:      21.3   20 - 24

 Est. FW:    6899  gm      2 lb 5 oz     34  %
Gestational Age

 LMP:           28w 0d        Date:  11/11/12                 EDD:   08/18/13
 Clinical EDD:  28w 0d                                        EDD:   08/18/13
 U/S Today:     27w 0d                                        EDD:   08/25/13
 Best:          28w 0d     Det. By:  LMP  (11/11/12)          EDD:   08/18/13
Anatomy
 Cranium:          Appears normal         Aortic Arch:      Not well visualized
 Fetal Cavum:      Previously seen        Ductal Arch:      Previously seen
 Ventricles:       Appears normal         Diaphragm:        Previously seen
 Choroid Plexus:   Previously seen        Stomach:          Appears normal, left
                                                            sided
 Cerebellum:       Previously seen        Abdomen:          Previously seen
 Posterior Fossa:  Previously seen        Abdominal Wall:   Previously seen
 Nuchal Fold:      Previously seen        Cord Vessels:     Previously seen
 Face:             Orbits appear          Kidneys:          Appear normal
                   normal
 Lips:             Previously seen        Bladder:          Appears normal
 Heart:            Echogenic focus        Spine:            Previously seen
                   in LV
 RVOT:             Previously seen        Lower             Previously seen
                                          Extremities:
 LVOT:             Appears normal         Upper             Previously seen
                                          Extremities:

 Other:  Male gender. 5th digit previously visualized. Technically difficult due
         to maternal habitus and fetal position.
Cervix Uterus Adnexa

 Cervical Length:    3.53     cm

 Cervix:       Normal appearance by transabdominal scan.
 Uterus:       No abnormality visualized.
 Cul De Sac:   No free fluid seen.

 Adnexa:     No abnormality visualized.
Impression

 Single IUP at 78w8d
 Echogenic intracardia focus is again noted in the left ventricle
 Interval anatomy is otherwise within normal limits
 Interval growth is appropriate (34th %tile)
 Normal amniotic fluid volume
Recommendations

 Recommend follow-up ultrasound examination in 4-6 weeks
 for growth due to diagnosis of gestational hypertension

 questions or concerns.

## 2014-10-22 IMAGING — US US FETAL BPP W/O NONSTRESS
1 series · 9 of 9 positions shown · non-contrast
Comparison: none

[Series 1: us fetal bpp w/o nonstress · non-contrast · 9 acquisitions, 9 frames shown]
[im 1/9]
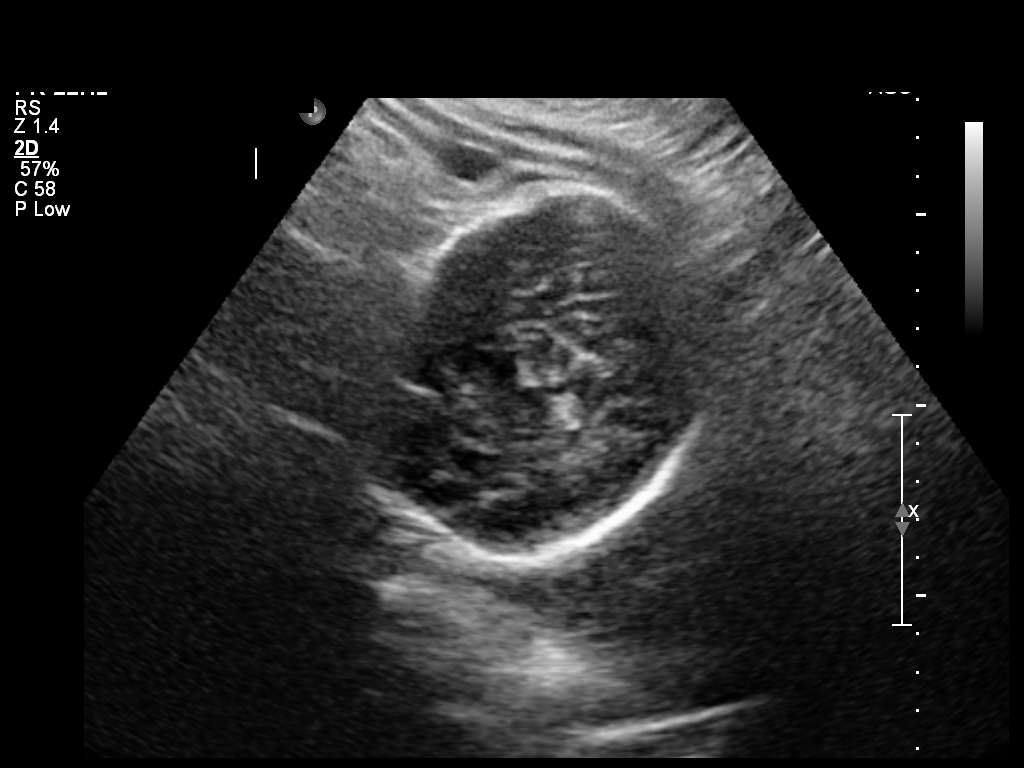
[im 2/9]
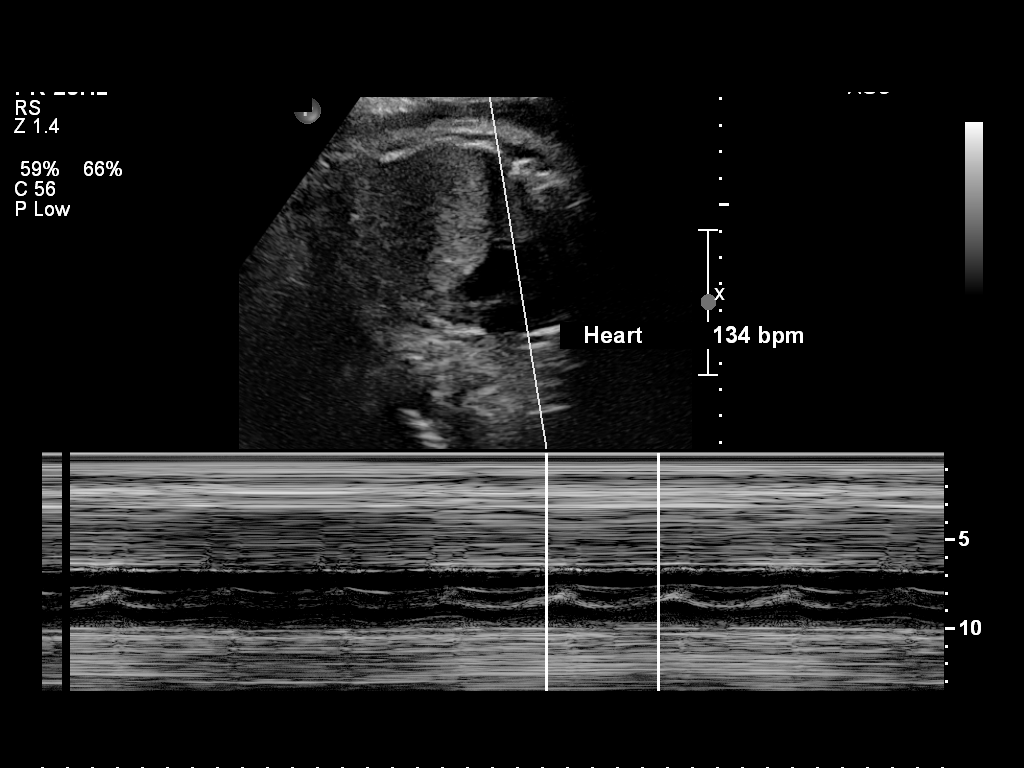
[im 3/9]
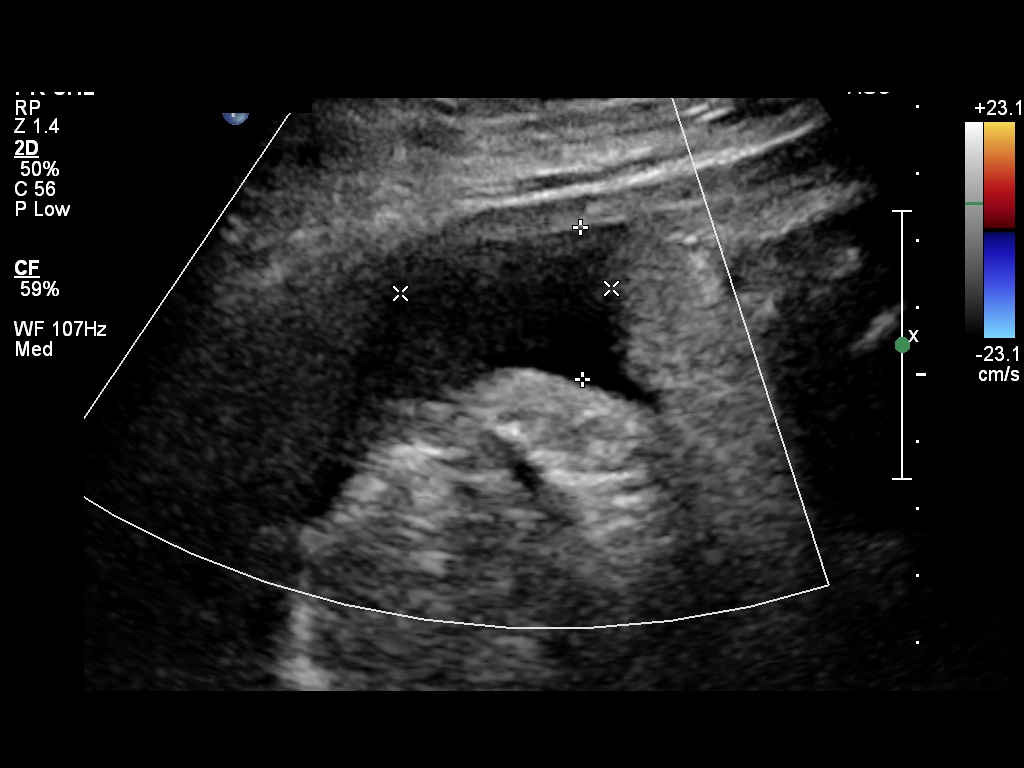
[im 4/9]
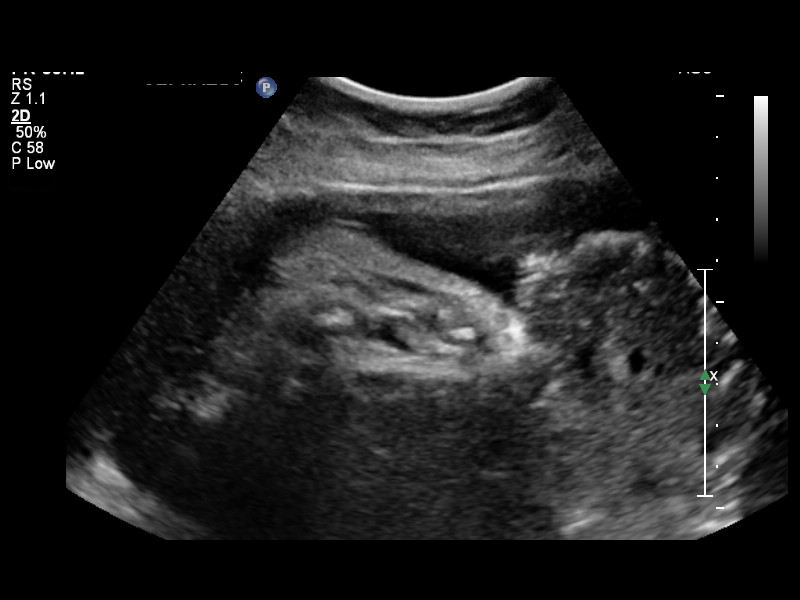
[im 5/9]
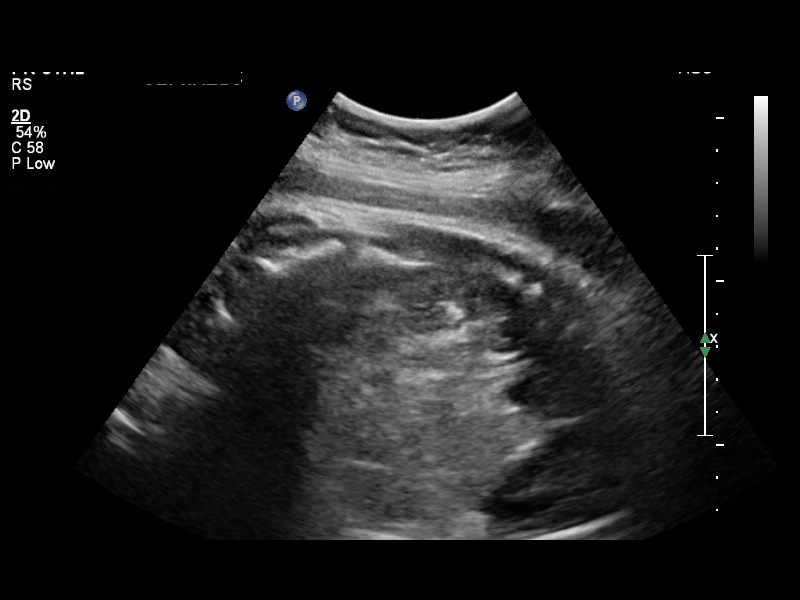
[im 6/9]
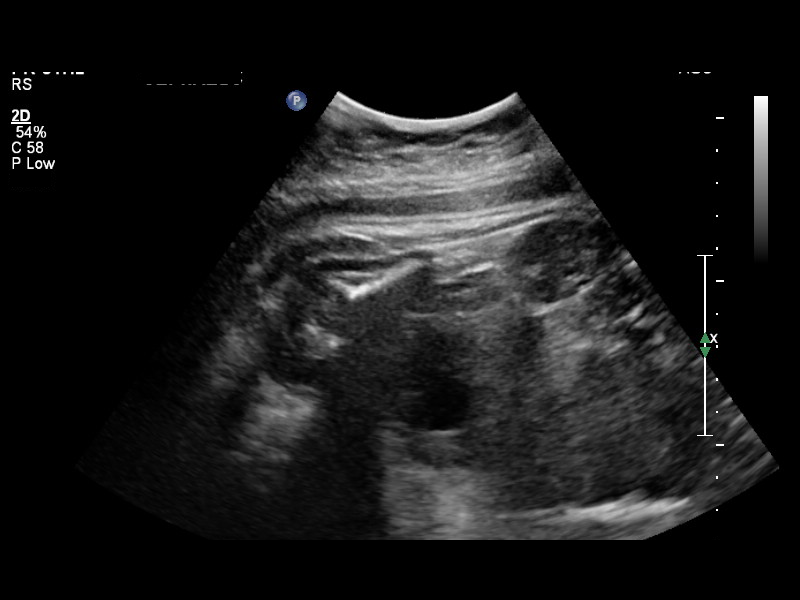
[im 7/9]
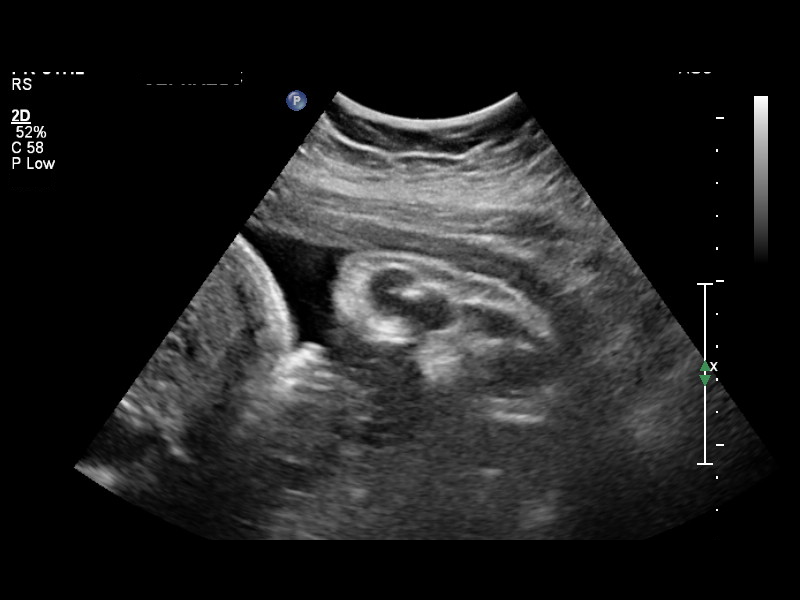
[im 8/9]
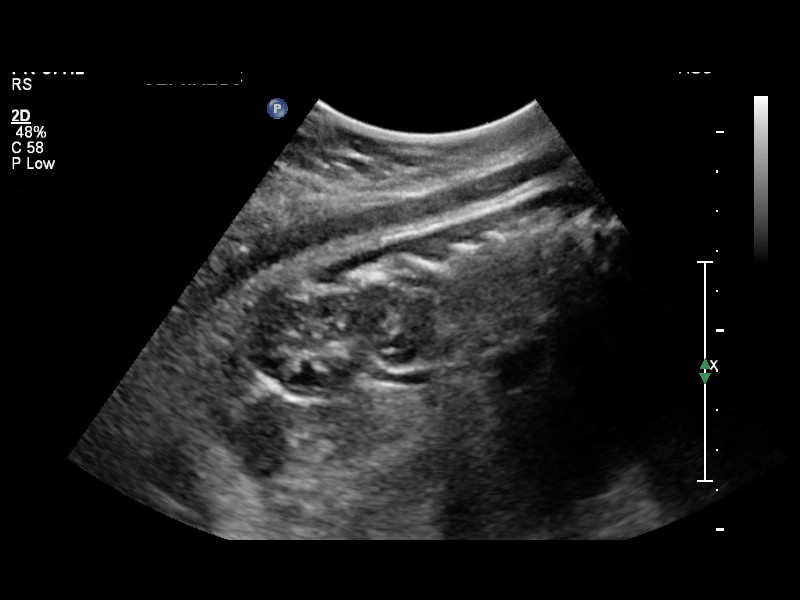
[im 9/9]
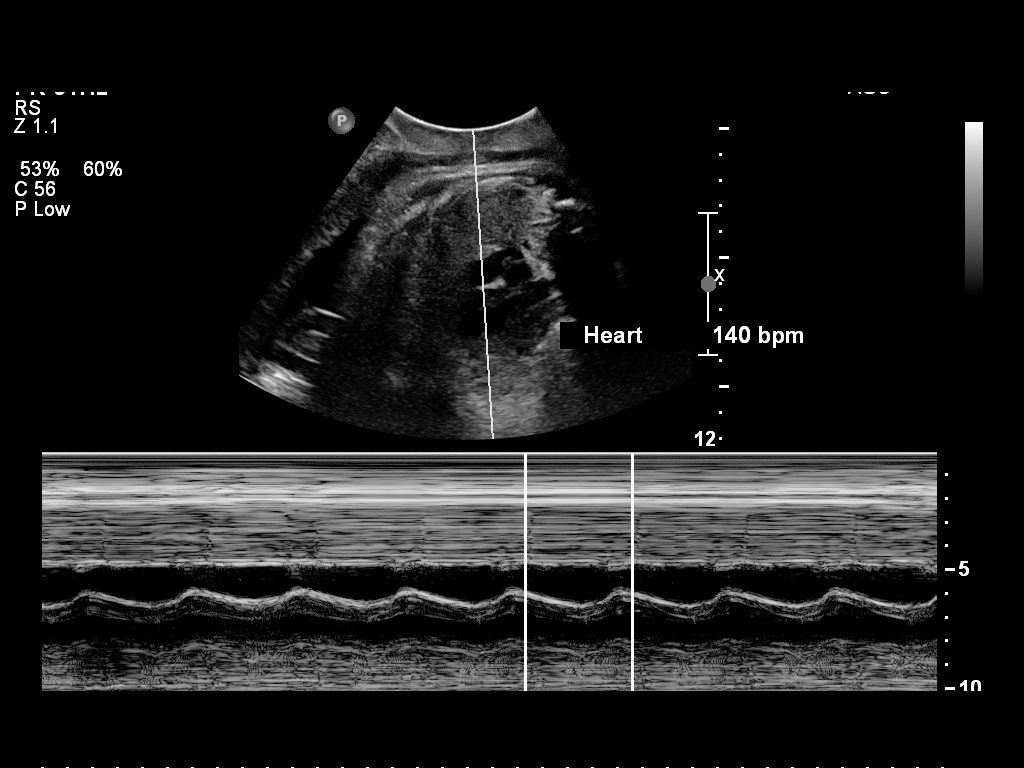

[9 of 9 positions shown; findings below may reference images not displayed]

OBSTETRICS REPORT
                      (Signed Final 08/03/2013 [DATE])

Service(s) Provided

Indications

 Non-reactive NST
Fetal Evaluation

 Num Of Fetuses:    1
 Fetal Heart Rate:  134                          bpm
 Cardiac Activity:  Observed
 Presentation:      Cephalic

 Amniotic Fluid
 AFI FV:      Subjectively within normal limits
                                             Larg Pckt:     2.3  cm
Biophysical Evaluation

 Amniotic F.V:   Pocket => 2 cm two         F. Tone:        Not Observed
                 planes
 F. Movement:    Observed                   Score:          [DATE]
 F. Breathing:   Not Observed
Gestational Age

 LMP:           37w 5d        Date:  11/11/12                 EDD:   08/18/13
 Clinical EDD:  37w 5d                                        EDD:   08/18/13
 Best:          37w 5d     Det. By:  LMP  (11/11/12)          EDD:   08/18/13
Impression

 SIUP at 37+5 weeks
 Low normal amniotic fluid volume
 BPP [DATE] (-2 for no BM and tone)
Recommendations

 Correlate BPP with clinical evaluation

 questions or concerns.

## 2015-01-03 ENCOUNTER — Emergency Department (INDEPENDENT_AMBULATORY_CARE_PROVIDER_SITE_OTHER)
Admission: EM | Admit: 2015-01-03 | Discharge: 2015-01-03 | Disposition: A | Discharge status: Home / Self Care | Payer: Self-pay | Source: Home / Self Care

## 2015-01-03 ENCOUNTER — Encounter (HOSPITAL_COMMUNITY): Payer: Self-pay | Admitting: *Deleted

## 2015-01-03 DIAGNOSIS — J029 Acute pharyngitis, unspecified: Secondary | ICD-10-CM

## 2015-01-03 LAB — POCT RAPID STREP A: Streptococcus, Group A Screen (Direct): NEGATIVE

## 2015-01-03 NOTE — ED Notes (Signed)
Pt  Reports    She        Has   Sinus  Pressure      /    sorethroat         Since  At      Symptoms  Not  releived  By otc          meds          Pt is  Sitting  Upright on the  Exam table  Speaking in  Complete  sentances  And  Is  In no  acute  Distress

## 2015-01-03 NOTE — ED Provider Notes (Signed)
CSN: [id number]     Arrival date & time 01/03/15  1341 History   None    Chief Complaint  Patient presents with  . Sore Throat   (Consider location/radiation/quality/duration/timing/severity/associated sxs/prior Treatment) HPI History obtained from patient:   LOCATION: upper resp SEVERITY:4 DURATION:1 week CONTEXT:URI then sore throat QUALITY:scratchy MODIFYING FACTORS:OTC meds without relief ASSOCIATED SYMPTOMS: ear fullness TIMING:now constant OCCUPATION:customer service  Past Medical History  Diagnosis Date  . Medical history non-contributory    Past Surgical History  Procedure Laterality Date  . No past surgeries     Family History  Problem Relation Age of Onset  . Asthma Sister   . Cancer Maternal Grandmother   . Cancer Paternal Grandmother   . Stroke Neg Hx   . Heart disease Neg Hx    Social History  Substance Use Topics  . Smoking status: Never Smoker   . Smokeless tobacco: Never Used  . Alcohol Use: No   OB History    Gravida Para Term Preterm AB TAB SAB Ectopic Multiple Living   Review of Systems ROS +'ve sore throat  Denies: HEADACHE, NAUSEA, ABDOMINAL PAIN, CHEST PAIN, CONGESTION, DYSURIA, SHORTNESS OF BREATH  Allergies  Review of patient's allergies indicates no known allergies.  Home Medications   Prior to Admission medications   Medication Sig Start Date End Date Taking? Authorizing Provider  ipratropium (ATROVENT) 0.06 % nasal spray Place 2 sprays into both nostrils 4 (four) times daily. 02/18/14   Linna Hoff, MD  norgestimate-ethinyl estradiol (ORTHO-CYCLEN,SPRINTEC,PREVIFEM) 0.25-35 MG-MCG tablet Take 1 tablet by mouth daily. 09/02/13   Tereso Newcomer, MD  Prenatal Vit-Fe Fumarate-FA (PRENATAL MULTIVITAMIN) TABS tablet Take 1 tablet by mouth daily at 12 noon.    Historical Provider, MD   Meds Ordered and Administered this Visit  Medications - No data to display  BP 94/57 mmHg  Pulse 85  Temp(Src) 98.1 F (36.7  C) (Oral)  Resp 16  SpO2 99% No data found.   Physical Exam  Constitutional: She is oriented to person, place, and time. She appears well-developed and well-nourished.  HENT:  Head: Normocephalic and atraumatic.  Right Ear: External ear normal.  Left Ear: External ear normal.  Mouth/Throat: Oropharynx is clear and moist.  Eyes: Conjunctivae are normal.  Cardiovascular: Normal rate.   Pulmonary/Chest: Effort normal and breath sounds normal.  Abdominal: Soft.  Neurological: She is alert and oriented to person, place, and time.  Skin: Skin is warm and dry.  Psychiatric: She has a normal mood and affect. Her behavior is normal. Judgment and thought content normal.  Nursing note and vitals reviewed.   ED Course  Procedures (including critical care time)  Labs Review Labs Reviewed - No data to display  Imaging Review No results found.   Visual Acuity Review  Right Eye Distance:   Left Eye Distance:   Bilateral Distance:    Right Eye Near:   Left Eye Near:    Bilateral Near:         MDM  No diagnosis found. Treatment options discussed including:RBS  RBS result:   Pt is stable for discharge at this time.   Continue symptomatic treatment at home. Advised to follow up with PCP or return to the clinic if there are new or worsening of symptoms.  All questions answered.  Instructions of care provided. Work note provided.   THIS NOTE WAS GENERATED USING A VOICE RECOGNITION SOFTWARE  PROGRAM.  ALL REASONABLE EFFORTS WERE MADE TO PROOFREAD THIS DOCUMENT FOR ACCURACY.      Tharon AquasFrank C Patrick, PA 01/03/15 1754

## 2015-01-03 NOTE — Discharge Instructions (Signed)

## 2015-01-06 LAB — CULTURE, GROUP A STREP: Strep A Culture: NEGATIVE

## 2018-09-18 ENCOUNTER — Other Ambulatory Visit: Payer: Self-pay

## 2018-09-18 DIAGNOSIS — Z20822 Contact with and (suspected) exposure to covid-19: Secondary | ICD-10-CM

## 2018-09-20 LAB — NOVEL CORONAVIRUS, NAA: SARS-CoV-2, NAA: NOT DETECTED

## 2019-02-19 NOTE — L&D Delivery Note (Signed)
Delivery Note   Patient Name: Karen Christensen DOB: 1991-02-17 MRN: [id number]  Date of admission: 08/19/2019 Delivering MD: Dale [place]  Date of delivery: 08/21/19 Type of delivery: SVD  Newborn Data: Live born female  Birth Weight:   APGAR: 9, 9  Newborn Delivery   Birth date/time: 08/21/2019 01:39:00 Delivery type: Vaginal, Spontaneous     Gayla Medicus, 29 y.o., @ [redacted]w[redacted]d,  G2P1001, who was admitted for IOL for GHTN, PCR was undetected, admitted on 7/1, GBS-, received x1 dose of cytotex then proceeded to have fetal decels, after fetus recovered, pitocin was choose then foley bulb placed, foley was out in 4 hours, pt progress with IUPC labor adequate with MVU 230s, fetal prolonged decel was noted to nadir of 60, IUPC was running at that time with 69ml/hr, FSE was placed, DR Su Hilt was called and aware, pt was found to be complete and progressed 2+ station in the second stage of labor.  She pushed for 10/min.  She delivered a viable infant, cephalic and restituted to the ROA position over an intact perineum.  A nuchal cord   was identified, loose and reduced. The baby was placed on maternal abdomen while initial step of NRP were perfmored (Dry, Stimulated, and warmed). Hat placed on baby for thermoregulation. Delayed cord clamping was performed for 2 minutes.  Cord double clamped and cut.  Cord cut by father. Apgar scores were 9 and 9. Prophylactic Pitocin was started in the third stage of labor for active management. The placenta delivered spontaneously, shultz, with a 3 vessel cord and was sent to LD.  Inspection revealed none. An examination of the vaginal vault and cervix was free from lacerations. The uterus was firm, bleeding stable. Umbilical artery blood gas was sent, but lab noted it to be clotted, no redraw indicated.  There were no complications during the procedure.  Mom and baby skin to skin following delivery. Left in stable condition. Pt will be transferred to PP unit, and CBC, with CMP  will be check in the morning. Pt denies HA< RUQ pain or vision changes.  BP 97/69   Pulse 84   Temp 98 F (36.7 C)   Resp 20   Ht 5\' 5"  (1.651 m)   Wt (!) 152 kg   LMP 12/05/2018   SpO2 99%   BMI 55.75 kg/m    Maternal Info: Anesthesia: Epidural Episiotomy: no Lacerations:  no Suture Repair: no Est. Blood Loss (mL):  12/07/2018  Newborn Info:  Baby Sex: female Circumcision: in pt desired  APGAR (1 MIN):  9 APGAR (5 MINS): 9  APGAR (10 MINS):     Mom to postpartum.  Baby to Couplet care / Skin to Skin.  Dr aware of delivery.  [place], LAHOLM, NP-C 08/21/19 2:11 AM

## 2019-02-24 ENCOUNTER — Encounter (HOSPITAL_COMMUNITY): Payer: Self-pay

## 2019-02-24 LAB — OB RESULTS CONSOLE GC/CHLAMYDIA
Chlamydia: NEGATIVE
Gonorrhea: NEGATIVE

## 2019-02-24 LAB — OB RESULTS CONSOLE HEPATITIS B SURFACE ANTIGEN
Hepatitis B Surface Ag: NEGATIVE
Hepatitis B Surface Ag: NEGATIVE

## 2019-02-24 LAB — OB RESULTS CONSOLE ANTIBODY SCREEN: Antibody Screen: NEGATIVE

## 2019-02-24 LAB — OB RESULTS CONSOLE ABO/RH: RH Type: POSITIVE

## 2019-02-24 LAB — OB RESULTS CONSOLE RPR: RPR: NONREACTIVE

## 2019-02-24 LAB — OB RESULTS CONSOLE RUBELLA ANTIBODY, IGM: Rubella: IMMUNE

## 2019-02-24 LAB — OB RESULTS CONSOLE HIV ANTIBODY (ROUTINE TESTING): HIV: NONREACTIVE

## 2019-03-16 ENCOUNTER — Other Ambulatory Visit (HOSPITAL_COMMUNITY): Payer: Self-pay | Admitting: Obstetrics and Gynecology

## 2019-03-16 DIAGNOSIS — Z3689 Encounter for other specified antenatal screening: Secondary | ICD-10-CM

## 2019-03-16 DIAGNOSIS — Z3A20 20 weeks gestation of pregnancy: Secondary | ICD-10-CM

## 2019-03-18 ENCOUNTER — Ambulatory Visit: Payer: 59 | Attending: Internal Medicine

## 2019-03-18 DIAGNOSIS — Z20822 Contact with and (suspected) exposure to covid-19: Secondary | ICD-10-CM

## 2019-03-19 LAB — NOVEL CORONAVIRUS, NAA: SARS-CoV-2, NAA: DETECTED — AB

## 2019-03-19 NOTE — Progress Notes (Signed)
Your test for COVID-19 was positive ("detected"), meaning that you were infected with the novel coronavirus and could give the germ to others.    Please continue isolation at home, for at least 10 days since the start of your fever/cough/breathlessness and until you have had 24 hours without fever (without taking a fever reducer) and with any cough/breathlessness improving. Use over-the-counter medications for symptoms.  If you have had no symptoms, but were exposed to someone who was positive for COVID-19, you will need to quarantine and self-isolate for 14 days from the date of exposure.    Please continue good preventive care measures, including:  frequent hand-washing, avoid touching your face, cover coughs/sneezes, stay out of crowds and keep a 6 foot distance from others.  Clean hard surfaces touched frequently with disinfectant cleaning products.   Please check in with your primary care provider about your positive test result.  Go to the nearest urgent care or ED for assessment if you have severe breathlessness or severe weakness/fatigue (ex needing new help getting out of bed or to the bathroom).  Members of your household will also need to quarantine for 14 days from the date of your positive test. You may be contacted to discuss possible treatment options, and you may also be contacted by the health department for follow up. Please call [place] at [phone] if you have any questions or concerns.     

## 2019-04-02 ENCOUNTER — Other Ambulatory Visit: Payer: Self-pay

## 2019-04-07 ENCOUNTER — Encounter (HOSPITAL_COMMUNITY): Payer: Self-pay | Admitting: *Deleted

## 2019-04-12 ENCOUNTER — Ambulatory Visit (HOSPITAL_BASED_OUTPATIENT_CLINIC_OR_DEPARTMENT_OTHER): Payer: 59 | Admitting: Genetic Counselor

## 2019-04-12 ENCOUNTER — Ambulatory Visit (HOSPITAL_COMMUNITY)
Admission: RE | Admit: 2019-04-12 | Discharge: 2019-04-12 | Disposition: A | Discharge status: Home / Self Care | Payer: 59 | Source: Ambulatory Visit | Attending: Obstetrics and Gynecology | Admitting: Obstetrics and Gynecology

## 2019-04-12 ENCOUNTER — Ambulatory Visit (HOSPITAL_COMMUNITY): Payer: Self-pay | Admitting: Genetic Counselor

## 2019-04-12 ENCOUNTER — Other Ambulatory Visit (HOSPITAL_COMMUNITY): Payer: Self-pay | Admitting: *Deleted

## 2019-04-12 ENCOUNTER — Other Ambulatory Visit: Payer: Self-pay

## 2019-04-12 ENCOUNTER — Other Ambulatory Visit (HOSPITAL_COMMUNITY): Payer: Self-pay | Admitting: Obstetrics and Gynecology

## 2019-04-12 DIAGNOSIS — D563 Thalassemia minor: Secondary | ICD-10-CM | POA: Diagnosis not present

## 2019-04-12 DIAGNOSIS — Z3A20 20 weeks gestation of pregnancy: Secondary | ICD-10-CM

## 2019-04-12 DIAGNOSIS — Z315 Encounter for genetic counseling: Secondary | ICD-10-CM | POA: Diagnosis not present

## 2019-04-12 DIAGNOSIS — Z3689 Encounter for other specified antenatal screening: Secondary | ICD-10-CM | POA: Diagnosis present

## 2019-04-12 DIAGNOSIS — Z362 Encounter for other antenatal screening follow-up: Secondary | ICD-10-CM

## 2019-04-12 DIAGNOSIS — O99212 Obesity complicating pregnancy, second trimester: Secondary | ICD-10-CM | POA: Diagnosis not present

## 2019-04-12 DIAGNOSIS — Z148 Genetic carrier of other disease: Secondary | ICD-10-CM | POA: Diagnosis not present

## 2019-04-12 DIAGNOSIS — Z363 Encounter for antenatal screening for malformations: Secondary | ICD-10-CM | POA: Insufficient documentation

## 2019-04-12 DIAGNOSIS — O99213 Obesity complicating pregnancy, third trimester: Secondary | ICD-10-CM | POA: Insufficient documentation

## 2019-04-12 NOTE — Progress Notes (Signed)
04/12/2019  Karen Christensen October 18, 1990 MRN: [id number] DOV: 04/12/2019  Karen Christensen presented to the [facility] for Maternal [facility]etal [facility] for a genetics consultation regarding her carrier status for alpha-thalassemia. Karen Christensen came to her appointment alone due to COVID-19 visitor restrictions.   Indication for genetic counseling - Silent carrier for alpha-thalassemia  Prenatal history  Karen Christensen is a G2P8138, 29 y.o. year old female. Her current pregnancy has completed [redacted]w[redacted]d (Estimated Date of Delivery: 08/26/19).  Karen Christensen denied exposure to environmental toxins or chemical agents. She denied the use of alcohol, tobacco or street drugs. She reported taking Tylenol, prenatal vitamins, and Delsym. She denied significant viral illnesses, fevers, and bleeding during the course of her pregnancy. Her medical and surgical histories were noncontributory.  Family History  A three generation pedigree was drafted and reviewed. The family history is remarkable for the following:  - Karen Christensen, Karen Christensen, was born with spina bifida. He underwent surgical correction for this in childhood. Spina bifida has not had a significant impact on him since then. We discussed that many forms of spina bifida are multifactorial in nature, occurring due to a combination of genetic and environmental factors that are difficult to identify. Approximately 5-10% of individuals with open neural tube defects (ONTDs) such as spina bifida have an underlying chromosome condition; however, individuals with these conditions often have other features in addition to spina bifida. Karen Christensen was counseled that if her Christensen's spina bifida was multifactorial in nature, there is a 4% chance of recurrence for the couple's children.   The remaining family histories were reviewed and found to be noncontributory for birth defects, intellectual disability, recurrent pregnancy loss, and known genetic conditions.    The patient's  ethnicity is African American. The father of the pregnancy's ethnicity is African American. Ashkenazi Jewish ancestry and consanguinity were denied. Pedigree will be scanned under Media.  Discussion  Karen Christensen had carrier screening for alpha-thalassemia performed through [facility]. The results of the screen identified her as a silent carrier for alpha-thalassemia (aa/a-). Alpha-thalassemia is different in its inheritance compared to other hemoglobinopathies as there are two copies of two alpha globin genes (HBA1 and HBA2) on each chromosome 16, or four alpha globin genes total (aa/aa). A person can be a carrier of one alpha gene mutation (aa/a-), also referred to as a "silent carrier". A person who carries two alpha globin gene mutations can either carry them in cis (both on the same chromosome, denoted as aa/--) or in trans (on different chromosomes, denoted as a-/a-). Alpha-thalassemia carriers of two mutations who have African American ancestry are more likely to have a trans arrangement (a-/a-); cis configuration is reported to be rare in individuals with African American ancestry.     There are several different forms of alpha-thalassemia. The most severe form of alpha-thalassemia, Hb Barts, is associated with an absence of alpha globin chain synthesis as a result of deletions of all four alpha globin genes (--/--).  Given that Karen Christensen is a silent carrier (aa/a-), her pregnancies would not be at increased risk for Hb Barts, even if her Christensen is a carrier for alpha-thalassemia, as she will always pass on at least one copy of the alpha globin gene to her children. Hemoglobin H (HbH) disease is caused by three deleted or dysfunctioning alpha globin alleles (a-/--) and is characterized by microcytic hypochromic hemolytic anemia, hepatosplenomegaly, mild jaundice, growth retardation, and sometimes thalassemia-like bone changes. Given Karen Christensen's silent carrier status (aa/a-), the current fetus  would only be  at risk for HbH disease (a-/--), if her Christensen is a carrier for two alpha globin mutations in cis (aa/--). If this is the case, the risk for HbH disease in the pregnancy would be 1 in 4 (25%). However, if Karen Christensen's Christensen is a carrier for two alpha globin mutations, he would be more likely to carry them in trans configuration (a-/a-) than the cis configuration (aa/--), given his ethnicity. If he is a carrier of alpha-thalassemia in trans, then the pregnancy would not be at increased risk for HbH disease. Based on the carrier frequency for alpha-thalassemia in the African American population, Karen Christensen has a 1 in 30 chance of being any type of carrier for alpha-thalassemia. We discussed that carrier testing for alpha-thalassemia is recommended for Karen Christensen to refine the risk for alpha-thalassemia in the current pregnancy. Karen Christensen indicated that she is interested in pursuing Christensen carrier screening.  Karen Christensen also had a normal hemoglobin electrophoresis, significantly reducing her risk of her being a carrier for hemoglobinopathies such as sickle cell disease. This significantly decrease the chance of her pregnancies being affected by a hemoglobinopathy.   Karen Christensen previously had quad screening performed which was negative. Based on these results, the risk for her pregnancy to be affected by Down syndrome is 1 in 305, and the risk for trisomy 84 is not increased over her 1 in 55 age-related risk. Additionally, quad screen results were low-risk for open neural tube defects (ONTDs) such as spina bifida. While this screen significantly reduces the likelihood of the pregnancy being affected by trisomy 21, trisomy 72, or ONTDs, it cannot be considered diagnostic.    A complete ultrasound was performed today prior to our visit. The ultrasound report will be sent under separate cover. There were no visualized fetal anomalies or markers suggestive of aneuploidy. Level II ultrasound and  the MS-AFP measurement on quad screening together are able to detect ONTDs with 90-95% sensitivity. Thus, it is extremely unlikely that the current fetus has an ONTD. However, normal results from ultrasound and quad screening do not guarantee a normal baby, as 3-5% of newborns have some type of birth defect, many of which are not prenatally diagnosable.  Ms. Mask was counseled regarding diagnostic testing via amniocentesis. We discussed the technical aspects of the procedure and quoted up to a 1 in 500 (0.2%) risk for spontaneous pregnancy loss or other adverse pregnancy outcomes as a result of amniocentesis. Cultured cells from an amniocentesis sample allow for the visualization of a fetal karyotype, which can detect >99% of chromosomal aberrations. Chromosomal microarray can also be performed to identify smaller deletions or duplications of fetal chromosomal material. Amniocentesis could also be performed to assess whether the baby is affected by alpha-thalassemia or ONTDs via analysis of AF-AFP and acetylcholinesterase. After careful consideration, Ms. Moltz declined amniocentesis at this time. She understands that amniocentesis is available at any point after 16 weeks of pregnancy and that she may opt to undergo the procedure at a later date should she change her mind.  Ms. Gillock was interested in pursuing alpha-thalassemia carrier screening for her Christensen, [facility]. We made a plan for her to send me a photo of her Christensen's insurance card so that I could perform a benefits investigation to determine the expected out of pocket cost for Christensen carrier screening. If out of pocket cost is expected to be greater than $250, the laboratory [facility] offers a patient pay option of $250 for Christensen  carrier screening. Once I receive Mr. Hermanski's insurance information and complete his benefits investigation, I will contact the couple to facilitate Christensen carrier screening from there if still desired.  I  counseled Ms. Mennenga regarding the above risks and available options. The approximate face-to-face time with the genetic counselor was 30 minutes.  In summary:  Discussed carrier screening results and options for follow-up testing  Silent carrier for alpha-thalassemia  Desires Christensen carrier screening. She will send me a photo of her Christensen's insurance card. I will perform a benefits investigation and facilitate testing from there  Reviewed negative quad screen results  Reduction in risk for trisomy 21, trisomy 18, and open neural tube defects  Reviewed results of ultrasound  No fetal anomalies or markers seen  Reduction in risk for fetal aneuploidy  Offered additional testing and screening  Declined amniocentesis  Reviewed family history concerns   Gershon Crane, MS Genetic Counselor

## 2019-04-25 ENCOUNTER — Other Ambulatory Visit: Payer: Self-pay

## 2019-04-25 DIAGNOSIS — Z20822 Contact with and (suspected) exposure to covid-19: Secondary | ICD-10-CM

## 2019-04-26 LAB — NOVEL CORONAVIRUS, NAA: SARS-CoV-2, NAA: NOT DETECTED

## 2019-05-14 ENCOUNTER — Encounter (HOSPITAL_COMMUNITY): Payer: Self-pay

## 2019-05-14 ENCOUNTER — Ambulatory Visit (HOSPITAL_COMMUNITY): Payer: 59 | Attending: Obstetrics and Gynecology

## 2019-06-22 ENCOUNTER — Inpatient Hospital Stay (HOSPITAL_COMMUNITY)
Admission: EM | Admit: 2019-06-22 | Discharge: 2019-06-22 | Disposition: A | Discharge status: Home / Self Care | Payer: 59 | Attending: Obstetrics and Gynecology | Admitting: Obstetrics and Gynecology

## 2019-06-22 ENCOUNTER — Encounter (HOSPITAL_COMMUNITY): Payer: Self-pay | Admitting: Obstetrics and Gynecology

## 2019-06-22 ENCOUNTER — Other Ambulatory Visit: Payer: Self-pay

## 2019-06-22 DIAGNOSIS — O368130 Decreased fetal movements, third trimester, not applicable or unspecified: Secondary | ICD-10-CM | POA: Diagnosis not present

## 2019-06-22 DIAGNOSIS — O26893 Other specified pregnancy related conditions, third trimester: Secondary | ICD-10-CM | POA: Diagnosis not present

## 2019-06-22 DIAGNOSIS — R109 Unspecified abdominal pain: Secondary | ICD-10-CM | POA: Diagnosis not present

## 2019-06-22 DIAGNOSIS — O26899 Other specified pregnancy related conditions, unspecified trimester: Secondary | ICD-10-CM

## 2019-06-22 DIAGNOSIS — R1030 Lower abdominal pain, unspecified: Secondary | ICD-10-CM | POA: Diagnosis not present

## 2019-06-22 DIAGNOSIS — Z3A30 30 weeks gestation of pregnancy: Secondary | ICD-10-CM | POA: Insufficient documentation

## 2019-06-22 LAB — URINALYSIS, ROUTINE W REFLEX MICROSCOPIC
Bilirubin Urine: NEGATIVE
Glucose, UA: NEGATIVE mg/dL
Hgb urine dipstick: NEGATIVE
Ketones, ur: 5 mg/dL — AB
Nitrite: NEGATIVE
Protein, ur: NEGATIVE mg/dL
Specific Gravity, Urine: 1.018 (ref 1.005–1.030)
pH: 6 (ref 5.0–8.0)

## 2019-06-22 NOTE — Discharge Instructions (Signed)
Fetal Movement Counts Patient Name: ________________________________________________ Patient Due Date: ____________________ What is a fetal movement count?  A fetal movement count is the number of times that you feel your baby move during a certain amount of time. This may also be called a fetal kick count. A fetal movement count is recommended for every pregnant woman. You may be asked to start counting fetal movements as early as week 28 of your pregnancy. Pay attention to when your baby is most active. You may notice your baby's sleep and wake cycles. You may also notice things that make your baby move more. You should do a fetal movement count:  When your baby is normally most active.  At the same time each day. A good time to count movements is while you are resting, after having something to eat and drink. How do I count fetal movements? 1. Find a quiet, comfortable area. Sit, or lie down on your side. 2. Write down the date, the start time and stop time, and the number of movements that you felt between those two times. Take this information with you to your health care visits. 3. Write down your start time when you feel the first movement. 4. Count kicks, flutters, swishes, rolls, and jabs. You should feel at least 10 movements. 5. You may stop counting after you have felt 10 movements, or if you have been counting for 2 hours. Write down the stop time. 6. If you do not feel 10 movements in 2 hours, contact your health care provider for further instructions. Your health care provider may want to do additional tests to assess your baby's well-being. Contact a health care provider if:  You feel fewer than 10 movements in 2 hours.  Your baby is not moving like he or she usually does. Date: ____________ Start time: ____________ Stop time: ____________ Movements: ____________ Date: ____________ Start time: ____________ Stop time: ____________ Movements: ____________ Date: ____________  Start time: ____________ Stop time: ____________ Movements: ____________ Date: ____________ Start time: ____________ Stop time: ____________ Movements: ____________ Date: ____________ Start time: ____________ Stop time: ____________ Movements: ____________ Date: ____________ Start time: ____________ Stop time: ____________ Movements: ____________ Date: ____________ Start time: ____________ Stop time: ____________ Movements: ____________ Date: ____________ Start time: ____________ Stop time: ____________ Movements: ____________ Date: ____________ Start time: ____________ Stop time: ____________ Movements: ____________ This information is not intended to replace advice given to you by your health care provider. Make sure you discuss any questions you have with your health care provider. Document Revised: 09/24/2018 Document Reviewed: 09/24/2018 Elsevier Patient Education  2020 [employer]  

## 2019-06-22 NOTE — MAU Provider Note (Signed)
Chief Complaint:  Vaginal Bleeding and Decreased Fetal Movement   First Provider Initiated Contact with Patient 06/22/19 1404     HPI: Karen Christensen is a 29 y.o. G2P1001 at [redacted]w[redacted]d who presents to maternity admissions reporting abdominal cramping, vaginal bleeding, & decreased fetal movement.  Reports some intermittent lower abdominal cramping since last night. Not frequent. Currently denies pain. Denies n/v/d, constipation, dysuria, or LOF.  When she wiped this morning she saw pink spotting on toilet paper; has not seen any blood since. Denies vaginal discharge, no recent intercourse or exam. No abdominal trauma or fall.  Prior to coming to MAU, she hadn't felt baby move since last night.   Location: abdomen Quality: cramping Severity: currently none/10 in pain scale Duration: 1 day Timing: intermittent Modifying factors: none Associated signs and symptoms: vaginal spotting  Pregnancy Course: [facility]. ?[facility].   Past Medical History:  Diagnosis Date  . Medical history non-contributory    OB History  Gravida Para Term Preterm AB Living  2 1 1     1   SAB TAB Ectopic Multiple Live Births          1    # Outcome Date GA Lbr Len/2nd Weight Sex Delivery Anes PTL Lv  2 Current           1 Term 08/04/13 [redacted]w[redacted]d 17:38 / 00:02 3011 g M Vag-Spont EPI  LIV   Past Surgical History:  Procedure Laterality Date  . NO PAST SURGERIES     Family History  Problem Relation Age of Onset  . Asthma Sister   . Cancer Maternal Grandmother   . Cancer Paternal Grandmother   . Stroke Neg Hx   . Heart disease Neg Hx    Social History   Tobacco Use  . Smoking status: Never Smoker  . Smokeless tobacco: Never Used  Substance Use Topics  . Alcohol use: No  . Drug use: No   Allergies  Allergen Reactions  . Bee Pollen Rash   Medications Prior to Admission  Medication Sig Dispense Refill Last Dose  . aspirin EC 81 MG tablet Take 81 mg by mouth once.     . Prenatal Vit-Fe Fumarate-FA (PRENATAL  MULTIVITAMIN) TABS tablet Take 1 tablet by mouth daily at 12 noon.   06/22/2019 at Unknown time  . ipratropium (ATROVENT) 0.06 % nasal spray Place 2 sprays into both nostrils 4 (four) times daily. 15 mL 1   . norgestimate-ethinyl estradiol (ORTHO-CYCLEN,SPRINTEC,PREVIFEM) 0.25-35 MG-MCG tablet Take 1 tablet by mouth daily. 1 Package 11     I have reviewed patient's Past Medical Hx, Surgical Hx, Family Hx, Social Hx, medications and allergies.   ROS:  Review of Systems  Constitutional: Negative.   Eyes: Negative for visual disturbance.  Gastrointestinal: Positive for abdominal pain. Negative for constipation, diarrhea, nausea and vomiting.  Genitourinary: Positive for vaginal bleeding. Negative for dysuria and vaginal discharge.  Neurological: Negative for headaches.    Physical Exam   Patient Vitals for the past 24 hrs:  BP Temp Pulse Resp SpO2 Weight  06/22/19 1402 137/84 -- 90 -- -- --  06/22/19 1320 126/75 98.4 F (36.9 C) 93 12 100 % --  06/22/19 1313 -- -- -- -- -- (!) 154 kg  06/22/19 1243 (!) 147/83 98.1 F (36.7 C) 95 16 99 % --    Constitutional: Well-developed, well-nourished female in no acute distress.  Cardiovascular: normal rate & rhythm, no murmur Respiratory: normal effort, lung sounds clear throughout GI: Abd soft, non-tender, gravid appropriate for  gestational age. Pos BS x 4 MS: Extremities nontender, no edema, normal ROM Neurologic: Alert and oriented x 4.  GU:      Pelvic: NEFG, physiologic discharge, no blood, cervix clean.   Dilation: Closed Exam by:: Judeth Horn, NP  NST:  Baseline: 140 bpm, Variability: Good {> 6 bpm), Accelerations: Reactive and Decelerations: Absent   Labs: Results for orders placed or performed during the hospital encounter of 06/22/19 (from the past 24 hour(s))  Urinalysis, Routine w reflex microscopic     Status: Abnormal   Collection Time: 06/22/19  2:01 PM  Result Value Ref Range   Color, Urine YELLOW YELLOW   APPearance  CLOUDY (A) CLEAR   Specific Gravity, Urine 1.018 1.005 - 1.030   pH 6.0 5.0 - 8.0   Glucose, UA NEGATIVE NEGATIVE mg/dL   Hgb urine dipstick NEGATIVE NEGATIVE   Bilirubin Urine NEGATIVE NEGATIVE   Ketones, ur 5 (A) NEGATIVE mg/dL   Protein, ur NEGATIVE NEGATIVE mg/dL   Nitrite NEGATIVE NEGATIVE   Leukocytes,Ua LARGE (A) NEGATIVE   RBC / HPF 0-5 0 - 5 RBC/hpf   WBC, UA 6-10 0 - 5 WBC/hpf   Bacteria, UA FEW (A) NONE SEEN   Squamous Epithelial / LPF 21-50 0 - 5   Mucus PRESENT     Imaging:  No results found.  MAU Course: Orders Placed This Encounter  Procedures  . Culture, OB Urine  . Urinalysis, Routine w reflex microscopic  . Discharge patient   No orders of the defined types were placed in this encounter.   MDM: Elevated BP in the ED triage prior to patient being transferred to MAU. Normotensive in MAU. Elevated BP at 16 wk ob visit, ? CHTN. Denies headache or visual disturbance.   One episode of pale pink spotting that has not continued. Abdomen soft & non tender. No known trauma. No contractions. No blood on exam. Cervix closed/thick.  RH positive  Reports fetal movement has returned since being on fetal monitor in MAU. Category 1 tracing.  Assessment: 1. Abdominal cramping affecting pregnancy   2. [redacted] weeks gestation of pregnancy   3. Decreased fetal movements in third trimester, single or unspecified fetus     Plan: Discharge home in stable condition.  PTL & bleeding precautions Keep appointment in the office next week Discussed reasons to return to MAU Urine culture pending    Follow-up Information    [facility] Follow up.   Specialty: Obstetrics and Gynecology Contact information: [address] [place] [address] [phone]          Allergies as of 06/22/2019      Reactions   Bee Pollen Rash      Medication List    STOP taking these medications   ipratropium 0.06 % nasal  spray Commonly known as: Atrovent   norgestimate-ethinyl estradiol 0.25-35 MG-MCG tablet Commonly known as: ORTHO-CYCLEN     TAKE these medications   aspirin EC 81 MG tablet Take 81 mg by mouth once.   prenatal multivitamin Tabs tablet Take 1 tablet by mouth daily at 12 noon.       Judeth Horn, NP 06/22/2019 2:55 PM

## 2019-06-22 NOTE — ED Provider Notes (Signed)
MSE was initiated and I personally evaluated the patient and placed orders (if any) at  12:46 PM on Jun 22, 2019.   Briefly this 29 year old female G2P1 currently 30 weeks 5 days gestation presenting to emergency department today with chief complaint of vaginal bleeding x 1 day.  Patient states she woke up this morning and use the bathroom and noticed that when she wiped she had light pink blood on the toilet tissue.  She states she proceeded to go to work where she noticed she had abdominal and back pain.  She called her OB, [facility] who recommended she come to the emergency department for further evaluation. She denies any headache,fever, chills, nausea, vomiting, urinary symptoms, diarrhea.   PE: Constitutional: well-developed, well-nourished, no apparent distress HENT: normocephalic, atraumatic. Cardiovascular: normal rate and rhythm Pulmonary/Chest: effort normal; breath sounds clear and equal bilaterally; no wheezes or rales Abdominal: gravid abdomen, non tender to palpation Musculoskeletal: full ROM, no edema Neurological: alert with goal directed thinking Skin: warm and dry, no rash, no diaphoresis Psychiatric: normal mood and affect, normal behavior    On my assessment vital signs are stable.  Patient is nontoxic in appearance.  The patient appears stable so that the remainder of the MSE may be completed by another provider. Case discussed with MAU APP Melanie who accepts patient in transfer.   Portions of this note were generated with Scientist, clinical (histocompatibility and immunogenetics). Dictation errors may occur despite best attempts at proofreading.      Sherene Sires, PA-C 06/22/19 1254    Sabas Sous, MD 06/23/19 1030

## 2019-06-22 NOTE — MAU Note (Signed)
Pt states that she peed at 1120 and when she wiped she saw red blood on the tissue. Pt denies any other times since then.   Denies LOF.   Pt reports last feeling the baby move at 2200.   Pt reports intermittent  Cramping since yesterday.

## 2019-06-24 LAB — CULTURE, OB URINE

## 2019-06-27 LAB — OB RESULTS CONSOLE GBS: GBS: NEGATIVE

## 2019-08-16 ENCOUNTER — Encounter (HOSPITAL_COMMUNITY): Payer: Self-pay | Admitting: *Deleted

## 2019-08-16 ENCOUNTER — Telehealth (HOSPITAL_COMMUNITY): Payer: Self-pay | Admitting: *Deleted

## 2019-08-16 NOTE — Telephone Encounter (Signed)
Preadmission screen  

## 2019-08-17 ENCOUNTER — Other Ambulatory Visit: Payer: Self-pay | Admitting: Obstetrics and Gynecology

## 2019-08-17 ENCOUNTER — Other Ambulatory Visit (HOSPITAL_COMMUNITY)
Admission: RE | Admit: 2019-08-17 | Discharge: 2019-08-17 | Disposition: A | Discharge status: Home / Self Care | Payer: 59 | Source: Ambulatory Visit | Attending: Obstetrics and Gynecology | Admitting: Obstetrics and Gynecology

## 2019-08-17 DIAGNOSIS — Z01812 Encounter for preprocedural laboratory examination: Secondary | ICD-10-CM | POA: Insufficient documentation

## 2019-08-17 DIAGNOSIS — Z20822 Contact with and (suspected) exposure to covid-19: Secondary | ICD-10-CM | POA: Insufficient documentation

## 2019-08-17 LAB — SARS CORONAVIRUS 2 (TAT 6-24 HRS): SARS Coronavirus 2: NEGATIVE

## 2019-08-19 ENCOUNTER — Encounter (HOSPITAL_COMMUNITY): Payer: Self-pay | Admitting: Obstetrics and Gynecology

## 2019-08-19 ENCOUNTER — Inpatient Hospital Stay (HOSPITAL_COMMUNITY): Payer: 59

## 2019-08-19 ENCOUNTER — Inpatient Hospital Stay (HOSPITAL_COMMUNITY)
Admission: AD | Admit: 2019-08-19 | Discharge: 2019-08-22 | DRG: 807 | Disposition: A | Discharge status: Home / Self Care | Payer: 59 | Attending: Obstetrics and Gynecology | Admitting: Obstetrics and Gynecology

## 2019-08-19 ENCOUNTER — Other Ambulatory Visit: Payer: Self-pay

## 2019-08-19 DIAGNOSIS — O139 Gestational [pregnancy-induced] hypertension without significant proteinuria, unspecified trimester: Secondary | ICD-10-CM | POA: Diagnosis present

## 2019-08-19 DIAGNOSIS — Z3A39 39 weeks gestation of pregnancy: Secondary | ICD-10-CM | POA: Diagnosis not present

## 2019-08-19 DIAGNOSIS — D563 Thalassemia minor: Secondary | ICD-10-CM | POA: Diagnosis present

## 2019-08-19 DIAGNOSIS — D649 Anemia, unspecified: Secondary | ICD-10-CM | POA: Diagnosis present

## 2019-08-19 DIAGNOSIS — O99214 Obesity complicating childbirth: Secondary | ICD-10-CM | POA: Diagnosis present

## 2019-08-19 DIAGNOSIS — Z20822 Contact with and (suspected) exposure to covid-19: Secondary | ICD-10-CM | POA: Diagnosis present

## 2019-08-19 DIAGNOSIS — O134 Gestational [pregnancy-induced] hypertension without significant proteinuria, complicating childbirth: Secondary | ICD-10-CM | POA: Diagnosis present

## 2019-08-19 DIAGNOSIS — Z6841 Body Mass Index (BMI) 40.0 and over, adult: Secondary | ICD-10-CM

## 2019-08-19 LAB — COMPREHENSIVE METABOLIC PANEL
ALT: 10 U/L (ref 0–44)
AST: 12 U/L — ABNORMAL LOW (ref 15–41)
Albumin: 2.7 g/dL — ABNORMAL LOW (ref 3.5–5.0)
Alkaline Phosphatase: 105 U/L (ref 38–126)
Anion gap: 9 (ref 5–15)
BUN: 8 mg/dL (ref 6–20)
CO2: 20 mmol/L — ABNORMAL LOW (ref 22–32)
Calcium: 8.9 mg/dL (ref 8.9–10.3)
Chloride: 108 mmol/L (ref 98–111)
Creatinine, Ser: 0.47 mg/dL (ref 0.44–1.00)
GFR calc Af Amer: >60 mL/min (ref >60)
GFR calc non Af Amer: >60 mL/min (ref >60)
Glucose, Bld: 81 mg/dL (ref 70–99)
Potassium: 4.4 mmol/L (ref 3.5–5.1)
Sodium: 137 mmol/L (ref 135–145)
Total Bilirubin: 0.5 mg/dL (ref 0.3–1.2)
Total Protein: 6.5 g/dL (ref 6.5–8.1)

## 2019-08-19 LAB — PROTEIN / CREATININE RATIO, URINE
Creatinine, Urine: 48.19 mg/dL
Total Protein, Urine: <6 mg/dL

## 2019-08-19 LAB — CBC WITH DIFFERENTIAL/PLATELET
Abs Immature Granulocytes: 0.04 10*3/uL (ref 0.00–0.07)
Basophils Absolute: 0 10*3/uL (ref 0.0–0.1)
Basophils Relative: 0 %
Eosinophils Absolute: 0.1 10*3/uL (ref 0.0–0.5)
Eosinophils Relative: 1 %
HCT: 30.1 % — ABNORMAL LOW (ref 36.0–46.0)
Hemoglobin: 9.5 g/dL — ABNORMAL LOW (ref 12.0–15.0)
Immature Granulocytes: 1 %
Lymphocytes Relative: 27 %
Lymphs Abs: 2.4 10*3/uL (ref 0.7–4.0)
MCH: 25.7 pg — ABNORMAL LOW (ref 26.0–34.0)
MCHC: 31.6 g/dL (ref 30.0–36.0)
MCV: 81.6 fL (ref 80.0–100.0)
Monocytes Absolute: 0.7 10*3/uL (ref 0.1–1.0)
Monocytes Relative: 8 %
Neutro Abs: 5.5 10*3/uL (ref 1.7–7.7)
Neutrophils Relative %: 63 %
Platelets: 291 10*3/uL (ref 150–400)
RBC: 3.69 MIL/uL — ABNORMAL LOW (ref 3.87–5.11)
RDW: 14.3 % (ref 11.5–15.5)
WBC: 8.7 10*3/uL (ref 4.0–10.5)
nRBC: 0 % (ref 0.0–0.2)

## 2019-08-19 LAB — CBC
HCT: 31.9 % — ABNORMAL LOW (ref 36.0–46.0)
Hemoglobin: 9.9 g/dL — ABNORMAL LOW (ref 12.0–15.0)
MCH: 25.6 pg — ABNORMAL LOW (ref 26.0–34.0)
MCHC: 31 g/dL (ref 30.0–36.0)
MCV: 82.6 fL (ref 80.0–100.0)
Platelets: 346 10*3/uL (ref 150–400)
RBC: 3.86 MIL/uL — ABNORMAL LOW (ref 3.87–5.11)
RDW: 14.5 % (ref 11.5–15.5)
WBC: 8.6 10*3/uL (ref 4.0–10.5)
nRBC: 0 % (ref 0.0–0.2)

## 2019-08-19 LAB — ABO/RH: ABO/RH(D): B POS

## 2019-08-19 LAB — TYPE AND SCREEN
ABO/RH(D): B POS
Antibody Screen: NEGATIVE

## 2019-08-19 MED ORDER — LIDOCAINE HCL (PF) 1 % IJ SOLN: 30.0000 mL | INTRAMUSCULAR | Status: DC | PRN

## 2019-08-19 MED ORDER — TERBUTALINE SULFATE 1 MG/ML IJ SOLN: 0.2500 mg | Freq: Once | INTRAMUSCULAR | Status: DC | PRN

## 2019-08-19 MED ORDER — LACTATED RINGERS IV SOLN
500.0000 mL | INTRAVENOUS | Status: DC | PRN
  Administered 2019-08-21: 500 mL via INTRAVENOUS

## 2019-08-19 MED ORDER — MISOPROSTOL 25 MCG QUARTER TABLET
25.0000 ug | ORAL_TABLET | ORAL | Status: DC | PRN
  Administered 2019-08-19: 25 ug via VAGINAL
  Filled 2019-08-19: qty 1

## 2019-08-19 MED ORDER — FENTANYL CITRATE (PF) 100 MCG/2ML IJ SOLN
50.0000 ug | INTRAMUSCULAR | Status: DC | PRN
  Administered 2019-08-19: 50 ug via INTRAVENOUS
  Administered 2019-08-20 (×7): 100 ug via INTRAVENOUS
  Filled 2019-08-19 (×8): qty 2

## 2019-08-19 MED ORDER — LIDOCAINE HCL (PF) 1 % IJ SOLN: INTRAMUSCULAR | Status: DC | PRN

## 2019-08-19 MED ORDER — OXYCODONE-ACETAMINOPHEN 5-325 MG PO TABS: 1.0000 | ORAL_TABLET | ORAL | Status: DC | PRN

## 2019-08-19 MED ORDER — LACTATED RINGERS IV SOLN: INTRAVENOUS | Status: DC

## 2019-08-19 MED ORDER — SOD CITRATE-CITRIC ACID 500-334 MG/5ML PO SOLN
30.0000 mL | ORAL | Status: DC | PRN
  Administered 2019-08-21: 30 mL via ORAL
  Filled 2019-08-19: qty 30

## 2019-08-19 MED ORDER — SODIUM CHLORIDE (PF) 0.9 % IJ SOLN: INTRAMUSCULAR | Status: DC | PRN

## 2019-08-19 MED ORDER — OXYTOCIN-SODIUM CHLORIDE 30-0.9 UT/500ML-% IV SOLN
1.0000 m[IU]/min | INTRAVENOUS | Status: DC
  Administered 2019-08-19: 4 m[IU]/min via INTRAVENOUS
  Administered 2019-08-19: 2 m[IU]/min via INTRAVENOUS
  Administered 2019-08-20: 10 m[IU]/min via INTRAVENOUS

## 2019-08-19 MED ORDER — ONDANSETRON HCL 4 MG/2ML IJ SOLN
4.0000 mg | Freq: Four times a day (QID) | INTRAMUSCULAR | Status: DC | PRN
  Administered 2019-08-20: 4 mg via INTRAVENOUS
  Filled 2019-08-19: qty 2

## 2019-08-19 MED ORDER — OXYCODONE-ACETAMINOPHEN 5-325 MG PO TABS: 2.0000 | ORAL_TABLET | ORAL | Status: DC | PRN

## 2019-08-19 MED ORDER — OXYTOCIN BOLUS FROM INFUSION
333.0000 mL | Freq: Once | INTRAVENOUS | Status: AC
  Administered 2019-08-21: 333 mL via INTRAVENOUS

## 2019-08-19 MED ORDER — ACETAMINOPHEN 325 MG PO TABS: 650.0000 mg | ORAL_TABLET | ORAL | Status: DC | PRN

## 2019-08-19 MED ORDER — OXYTOCIN-SODIUM CHLORIDE 30-0.9 UT/500ML-% IV SOLN
2.5000 [IU]/h | INTRAVENOUS | Status: DC
  Administered 2019-08-21: 2.5 [IU]/h via INTRAVENOUS
  Filled 2019-08-19 (×2): qty 500

## 2019-08-19 NOTE — Anesthesia Preprocedure Evaluation (Deleted)
Anesthesia Evaluation  Patient identified by MRN, date of birth, ID band Patient awake    Reviewed: Allergy & Precautions, H&P , NPO status , Patient's Chart, lab work & pertinent test results  History of Anesthesia Complications Negative for: history of anesthetic complications  Airway Mallampati: II  TM Distance: >3 FB Neck ROM: full    Dental no notable dental hx.    Pulmonary asthma ,    Pulmonary exam normal        Cardiovascular hypertension, negative cardio ROS Normal cardiovascular exam Rhythm:regular Rate:Normal     Neuro/Psych negative neurological ROS  negative psych ROS   GI/Hepatic negative GI ROS, Neg liver ROS,   Endo/Other  Morbid obesity  Renal/GU      Musculoskeletal   Abdominal   Peds  Hematology  (+) Blood dyscrasia, anemia ,   Anesthesia Other Findings   Reproductive/Obstetrics (+) Pregnancy                             Anesthesia Physical Anesthesia Plan  ASA: III  Anesthesia Plan: Epidural   Post-op Pain Management:    Induction:   PONV Risk Score and Plan:   Airway Management Planned:   Additional Equipment:   Intra-op Plan:   Post-operative Plan:   Informed Consent: I have reviewed the patients History and Physical, chart, labs and discussed the procedure including the risks, benefits and alternatives for the proposed anesthesia with the patient or authorized representative who has indicated his/her understanding and acceptance.       Plan Discussed with:   Anesthesia Plan Comments:         Anesthesia Quick Evaluation

## 2019-08-19 NOTE — Progress Notes (Addendum)
Subjective:    Variable decels noted 1.5 hrs after Cytotec placement. Unsuccessful attempt at cervical balloon placement. Discussed Pitocin and pt agrees. Plans epidural for pain management.   Objective:    VS: BP 129/79   Pulse 90   Temp 98.4 F (36.9 C) (Oral)   Resp 16   Ht 5\' 5"  (1.651 m)   Wt (!) 152 kg   LMP 12/05/2018   BMI 55.75 kg/m  FHR : baseline 125 / variability moderate / accelerations present / absent decelerations Toco: contractions irregular Membranes: intact Dilation: 1 Effacement (%): 50 Cervical Position: Posterior Station: -3 Presentation: Vertex Exam by:: [email or web address], CNM  Assessment/Plan:   29 y.o. G2P1001 [redacted]w[redacted]d IOL for GHTN  Labor:  S/P Cytotec x1 dose    - unable to place cervical balloon, variables w/ last cytotec, will start Pitocin Preeclampsia:  no signs or symptoms of toxicity, labs stable and mild range BP Fetal Wellbeing:  Category I Pain Control:  plans epidural I/D:  GBS negative Anticipated MOD:  NSVD  [redacted]w[redacted]d MSN, CNM 08/19/2019 9:08 PM

## 2019-08-19 NOTE — Anesthesia Procedure Notes (Deleted)
Epidural Patient location during procedure: OB Start time: 08/19/2019 6:36 PM End time: 08/19/2019 6:45 PM  Staffing Anesthesiologist: Alexavier Tsutsui E, MD Performed: anesthesiologist   Preanesthetic Checklist Completed: patient identified, IV checked, risks and benefits discussed, monitors and equipment checked, pre-op evaluation and timeout performed  Epidural Patient position: sitting Prep: DuraPrep Patient monitoring: heart rate, continuous pulse ox and blood pressure Approach: midline Location: L3-L4 Injection technique: LOR air  Needle:  Needle type: Tuohy  Needle gauge: 17 G Needle length: 9 cm Needle insertion depth: 9 cm Catheter type: closed end flexible Catheter size: 19 Gauge Catheter at skin depth: 14 cm Test dose: negative  Assessment Events: blood not aspirated, injection not painful, no injection resistance, no paresthesia and negative IV test  Additional Notes Reason for block:procedure for pain     

## 2019-08-19 NOTE — H&P (Addendum)
OB ADMISSION/ HISTORY & PHYSICAL:  Admission Date: 08/19/2019 12:00 PM  Admit Diagnosis: IOL for Karen Christensen  Karen Christensen is a [age] y.o. female Karen Christensen [redacted]w[redacted]d presenting for IOL.  Denies HA, epigastric pain, neuro S/S today. Endorses active FM, denies LOF and vaginal bleeding. Hx Karen Christensen 25yrs ago, pelvis proven to 6#12oz. FOB has a hx of Karen Christensen resolved via surgery as a child.   History of current pregnancy: Karen Christensen   Patient entered care with [facility] at 16+5 wks.   EDC of 08/26/19 was established by Karen Christensen @13 +6  Anatomy scan:  20+4 wks, with normal findings and anterior placenta, anat completed @ .   Antenatal testing: for BMI of 54 started at 32 weeks, growth Q4 weeks since Karen weeks Last evaluation: 38+4 wks, vertex w/ oblique lie, AFI 15.8, BPP 8/8  Significant prenatal events:  Patient Active Problem List   Diagnosis Date Noted  . Gestational hypertension 08/19/2019  . Alpha thalassemia trait 08/19/2019  . Anemia 08/19/2019  . BMI 50.0-59.9, adult (HCC) 08/19/2019    Prenatal Labs: ABO, Rh: B/Positive/-- (01/06 0000) Antibody: Negative (01/06 0000) Rubella: Immune (01/06 0000)  RPR: Nonreactive (01/06 0000)  HBsAg: Negative (01/06 0000)  HIV: Non-reactive (01/06 0000)  GTT: normal 1 hour GBS: Negative/-- (05/09 0000)  GC/CHL: neg/neg Genetics: AFP normal    OB History  Gravida Para Term Preterm AB Living  2 1 1     1   SAB TAB Ectopic Multiple Live Births          1    # Outcome Date GA Lbr Len/2nd Weight Sex Delivery Anes PTL Lv  2 Current           1 Term 08/04/13 [redacted]w[redacted]d 17:38 / 00:02 3011 g M Vag-Spont EPI  LIV    Medical / Surgical History: Past medical history:  Past Medical History:  Diagnosis Date  . Medical history non-contributory   . Pregnancy induced hypertension     Past surgical history:  Past Surgical History:  Procedure Laterality Date  . NO PAST SURGERIES     Family History:  Family History  Problem Relation Age of Onset  . Asthma Sister   . Cancer  Maternal Grandmother   . Cancer Paternal Grandmother   . Stroke Neg Hx   . Heart disease Neg Hx     Social History:  reports that she has never smoked. She has never used smokeless tobacco. She reports that she does not drink alcohol and does not use drugs.  Allergies: Bee pollen   Current Medications at time of admission:  Prior to Admission medications   Medication Sig Start Date End Date Taking? Authorizing Provider  aspirin EC 81 MG tablet Take 81 mg by mouth once.    [provider]  Prenatal Vit-Fe Fumarate-FA (PRENATAL MULTIVITAMIN) TABS tablet Take 1 tablet by mouth daily at 12 noon.    [provider]    Review of Systems: Constitutional: Negative   HENT: Negative   Eyes: Negative   Respiratory: Negative   Cardiovascular: Negative   Gastrointestinal: Negative  Genitourinary: neg for bloody show, neg for LOF   Musculoskeletal: Negative   Skin: Negative   Neurological: Negative   Endo/Heme/Allergies: Negative   Psychiatric/Behavioral: Negative    Physical Exam: VS: Last menstrual period 12/05/2018. AAO x3, no signs of distress Cardiovascular: RRR Respiratory: Lung fields clear to ausculation GU/GI: Abdomen gravid, non-tender, non-distended, active FM, vertex Extremities: 1+ non-pitting edema, negative for pain, tenderness, and cords  Cervical  exam:  Dilation: Closed Effacement (%): Thick Cervical Position: Posterior Station: Ballotable Presentation: Vertex Exam by:: Christensen Karen, CNM  FHR: baseline rate 130 / variability moderate / accelerations present / absent decelerations TOCO: none per TOCO, none palpated   Prenatal Transfer Tool  Maternal Diabetes: No Genetic Screening: Normal Maternal Ultrasounds/Referrals: Normal Fetal Ultrasounds or other Referrals:  None Maternal Substance Abuse:  No Significant Maternal Medications:  Meds include: Other: Baby aspirin Significant Maternal Lab Results: Group B Strep  negative    Assessment: [age] y.o. Karen Christensen [redacted]w[redacted]d  IOL for Karen Christensen Induction stage of labor- first dose Cytotec now FHR category 1 GBS negative Pain management plan: epidural   Plan:  Admit to L&D Routine admission orders Cytotec IOL then cervical balloon, Pitocin, and AROM as needed Epidural PRN  Dr Normand Sloop notified of admission and plan of care  Roma Schanz MSN, CNM 08/19/2019 12:23 PM

## 2019-08-20 ENCOUNTER — Inpatient Hospital Stay (HOSPITAL_COMMUNITY): Payer: 59 | Admitting: Anesthesiology

## 2019-08-20 ENCOUNTER — Encounter (HOSPITAL_COMMUNITY): Payer: Self-pay | Admitting: Obstetrics and Gynecology

## 2019-08-20 ENCOUNTER — Encounter (HOSPITAL_COMMUNITY): Payer: Self-pay | Admitting: Anesthesiology

## 2019-08-20 LAB — CBC
HCT: 31.9 % — ABNORMAL LOW (ref 36.0–46.0)
Hemoglobin: 9.9 g/dL — ABNORMAL LOW (ref 12.0–15.0)
MCH: 25.8 pg — ABNORMAL LOW (ref 26.0–34.0)
MCHC: 31 g/dL (ref 30.0–36.0)
MCV: 83.1 fL (ref 80.0–100.0)
Platelets: 318 10*3/uL (ref 150–400)
RBC: 3.84 MIL/uL — ABNORMAL LOW (ref 3.87–5.11)
RDW: 14.5 % (ref 11.5–15.5)
WBC: 7.8 10*3/uL (ref 4.0–10.5)
nRBC: 0 % (ref 0.0–0.2)

## 2019-08-20 LAB — RPR: RPR Ser Ql: NONREACTIVE

## 2019-08-20 MED ORDER — FENTANYL-BUPIVACAINE-NACL 0.5-0.125-0.9 MG/250ML-% EP SOLN
12.0000 mL/h | EPIDURAL | Status: DC | PRN
  Filled 2019-08-20: qty 250

## 2019-08-20 MED ORDER — EPHEDRINE 5 MG/ML INJ: 10.0000 mg | INTRAVENOUS | Status: DC | PRN

## 2019-08-20 MED ORDER — SODIUM CHLORIDE (PF) 0.9 % IJ SOLN
INTRAMUSCULAR | Status: DC | PRN
  Administered 2019-08-20: 12 mL/h via EPIDURAL

## 2019-08-20 MED ORDER — LACTATED RINGERS AMNIOINFUSION: INTRAVENOUS | Status: DC

## 2019-08-20 MED ORDER — PHENYLEPHRINE 40 MCG/ML (10ML) SYRINGE FOR IV PUSH (FOR BLOOD PRESSURE SUPPORT): 80.0000 ug | PREFILLED_SYRINGE | INTRAVENOUS | Status: DC | PRN

## 2019-08-20 MED ORDER — LACTATED RINGERS IV SOLN: 500.0000 mL | Freq: Once | INTRAVENOUS | Status: DC

## 2019-08-20 MED ORDER — LIDOCAINE HCL (PF) 1 % IJ SOLN
INTRAMUSCULAR | Status: DC | PRN
  Administered 2019-08-20 (×2): 4 mL via EPIDURAL

## 2019-08-20 MED ORDER — DIPHENHYDRAMINE HCL 50 MG/ML IJ SOLN: 12.5000 mg | INTRAMUSCULAR | Status: DC | PRN

## 2019-08-20 NOTE — Anesthesia Procedure Notes (Signed)
Epidural Patient location during procedure: OB Start time: 08/20/2019 2:28 PM  Staffing Anesthesiologist: Mal Amabile, MD Performed: anesthesiologist   Preanesthetic Checklist Completed: patient identified, IV checked, site marked, risks and benefits discussed, surgical consent, monitors and equipment checked, pre-op evaluation and timeout performed  Epidural Patient position: sitting Prep: DuraPrep and site prepped and draped Patient monitoring: continuous pulse ox and blood pressure Approach: midline Location: L4-L5 Injection technique: LOR air  Needle:  Needle type: Tuohy  Needle gauge: 17 G Needle length: 9 cm and 9 Needle insertion depth: 5 cm and 10 cm Catheter type: closed end flexible Catheter size: 19 Gauge Catheter at skin depth: 10 and 16 cm Test dose: negative and Other  Assessment Events: blood not aspirated, injection not painful, no injection resistance, no paresthesia and negative IV test  Additional Notes Patient identified. Risks and benefits discussed including failed block, incomplete  Pain control, post dural puncture headache, nerve damage, paralysis, blood pressure Changes, nausea, vomiting, reactions to medications-both toxic and allergic and post Partum back pain. All questions were answered. Patient expressed understanding and wished to proceed. Sterile technique was used throughout procedure. Epidural site was Dressed with sterile barrier dressing. No paresthesias, signs of intravascular injection Or signs of intrathecal spread were encountered.  Patient was more comfortable after the epidural was dosed. Please see RN's note for documentation of vital signs and FHR which are stable. Reason for block:procedure for pain

## 2019-08-20 NOTE — Progress Notes (Signed)
Per [facility] CNM at the bedside, pt to be off monitor while waiting for epidural

## 2019-08-20 NOTE — Anesthesia Preprocedure Evaluation (Signed)
Anesthesia Evaluation  Patient identified by MRN, date of birth, ID band Patient awake    Reviewed: Allergy & Precautions, NPO status , Patient's Chart, lab work & pertinent test results  Airway Mallampati: III  TM Distance: >3 FB Neck ROM: Full    Dental no notable dental hx. (+) Teeth Intact   Pulmonary neg pulmonary ROS,    Pulmonary exam normal breath sounds clear to auscultation       Cardiovascular hypertension, Normal cardiovascular exam Rhythm:Regular Rate:Normal     Neuro/Psych negative neurological ROS  negative psych ROS   GI/Hepatic Neg liver ROS, GERD  ,  Endo/Other  Morbid obesity  Renal/GU negative Renal ROS  negative genitourinary   Musculoskeletal negative musculoskeletal ROS (+)   Abdominal (+) + obese,   Peds  Hematology  (+) anemia ,   Anesthesia Other Findings   Reproductive/Obstetrics (+) Pregnancy Gestational HTN                             Anesthesia Physical  Anesthesia Plan  ASA: III  Anesthesia Plan: Epidural   Post-op Pain Management:    Induction:   PONV Risk Score and Plan:   Airway Management Planned: Natural Airway  Additional Equipment:   Intra-op Plan:   Post-operative Plan:   Informed Consent: I have reviewed the patients History and Physical, chart, labs and discussed the procedure including the risks, benefits and alternatives for the proposed anesthesia with the patient or authorized representative who has indicated his/her understanding and acceptance.       Plan Discussed with: Anesthesiologist  Anesthesia Plan Comments:         Anesthesia Quick Evaluation

## 2019-08-20 NOTE — Progress Notes (Signed)
Labor Progress Note  Karen Christensen is a 29 y.o. female G2P1001 [redacted]w[redacted]d presenting for IOL for GHTN on 7/1, no meds, PCR was undectectable.  Denies HA, epigastric pain, neuro S/S today. Endorses active FM, denies LOF and vaginal bleeding. Hx SVD 65yrs ago, pelvis proven to 6#12oz. FOB has a hx of spina bifida resolved via surgery as a child. H/O Thalassemia.   Subjective: Pt in bed feeling much better , comfortable post epidural placement. Discussed AROM with IUPC placement, discussed R/B/A, pt verbalized consent for both.  Patient Active Problem List   Diagnosis Date Noted  . Gestational hypertension 08/19/2019  . Alpha thalassemia trait 08/19/2019  . Anemia 08/19/2019  . BMI 50.0-59.9, adult (HCC) 08/19/2019   Objective: BP 117/68   Pulse 85   Temp 98.1 F (36.7 C) (Oral)   Resp 16   Ht 5\' 5"  (1.651 m)   Wt (!) 152 kg   LMP 12/05/2018   SpO2 100%   BMI 55.75 kg/m  No intake/output data recorded. No intake/output data recorded. NST: FHR baseline 135 bpm, Variability: moderate, Accelerations:present, Decelerations:  Absent= Cat 1/Reactive CTX:  irregular, every 2-5 minutes, lasting 40-60 seconds Uterus gravid, soft non tender, mild to palpate with contractions.  SVE:  Dilation: 5 Effacement (%): 60 Station: -3 Exam by:: J Olsen Mccutchan CNM Pitocin at (26) mUn/min  Blood show present AROM, clear, tolerated well, moderate amount IUPC placed, pt and fetus tolerated well.   Assessment:  Karen Christensen is a 29 y.o. female G2P1001 [redacted]w[redacted]d presenting for IOL for GHTN on 7/1, no meds, PCR was undectectable.  Denies HA, epigastric pain, neuro S/S today. Endorses active FM, denies LOF and vaginal bleeding. Hx SVD 70yrs ago, pelvis proven to 6#12oz. FOB has a hx of spina bifida resolved via surgery as a child. H/O Thalassemia. Progressing in latent labor with pit of 26 and AROM with IUPC placed.  Patient Active Problem List   Diagnosis Date Noted  . Gestational hypertension 08/19/2019  . Alpha  thalassemia trait 08/19/2019  . Anemia 08/19/2019  . BMI 50.0-59.9, adult (HCC) 08/19/2019   NICHD: Category 1  Membranes:  AROM, clear 7/2 @ 1730, no s/s of infection  Induction:    Cytotec x1 dose at 1412, no more given due to fetal  intolerance.   Foley Bulb: inserted  0900 on 7/2, out at 1300  Pitocin - 26  IUPC: MVUs: 220s, labor adaquate  Pain management:               IV pain management: xFentayl @ 2231, 0024, 0444,  0922, 1219, 0159, 0809             Epidural placement:  Placed 7/2 @ 1442  GBS Negative  GHTN: BP 117/68, asymptomatic, PCR undetectable, labs  unremarkable.    Plan: Continue labor plan Continuous monitoring Rest Ambulate Frequent position changes to facilitate fetal rotation and descent. Will reassess with cervical exam when necessary Continue pitocin per protocol Anticipate foley coming out then breaking water.  Anticipate labor progression and vaginal delivery.   Md 9/2 aware of plan and verbalized agreement.   Su Hilt, NP-C, CNM, MSN 08/20/2019. 6:36 PM

## 2019-08-20 NOTE — Progress Notes (Addendum)
Labor Progress Note  Karen Christensen is a 29 y.o. female G2P1001 [redacted]w[redacted]d presenting for IOL for GHTN on 7/1, no meds, PCR was undectectable.  Denies HA, epigastric pain, neuro S/S today. Endorses active FM, denies LOF and vaginal bleeding. Hx SVD 81yrs ago, pelvis proven to 6#12oz. FOB has a hx of spina bifida resolved via surgery as a child. H/O Thalassemia.   Subjective: Pt in bed resting, pt endorsees being very fatigued, pt tearful and fearful about indication process. Reviewed Foley bulb and pitocin and AROM'in R/B/A, pt verbalized consent for foley placement. Pt was soothed and calmed, she was offered atarax for anxiety and has declined at this time. Pt has good support system in room. Discussed pt getting epidural now instead of nuerrmous fentanyl doses. Denies HA, RUQ pain or vision changes.  Patient Active Problem List   Diagnosis Date Noted  . Gestational hypertension 08/19/2019  . Alpha thalassemia trait 08/19/2019  . Anemia 08/19/2019  . BMI 50.0-59.9, adult (HCC) 08/19/2019   Objective: BP 117/89   Pulse 89   Temp 98 F (36.7 C) (Oral)   Resp 16   Ht 5\' 5"  (1.651 m)   Wt (!) 152 kg   LMP 12/05/2018   BMI 55.75 kg/m  No intake/output data recorded. No intake/output data recorded. NST: FHR baseline 125 bpm, Variability: moderate, Accelerations:present, Decelerations:  Absent= Cat 1/Reactive CTX:  irregular, every 2-4 minutes, lasting 20-50 seconds Uterus gravid, soft non tender, mild to palpate with contractions.  SVE:  Dilation: 2 Effacement (%): 60 Station: -3 Exam by:: J Arlita Buffkin CNM Pitocin at (16) mUn/min  Foley placed cook cath and utilized both [email or web address] in both vaginal and uterine balloons with NS.   Assessment:  Karen Christensen is a 29 y.o. female G2P1001 [redacted]w[redacted]d presenting for IOL for GHTN on 7/1, no meds, PCR was undectectable.  Denies HA, epigastric pain, neuro S/S today. Endorses active FM, denies LOF and vaginal bleeding. Hx SVD 34yrs ago, pelvis proven to 6#12oz. FOB  has a hx of spina bifida resolved via surgery as a child. H/O Thalassemia.  Foley placed, Early labor prodromal , no cervical change in last 4 hours.  Patient Active Problem List   Diagnosis Date Noted  . Gestational hypertension 08/19/2019  . Alpha thalassemia trait 08/19/2019  . Anemia 08/19/2019  . BMI 50.0-59.9, adult (HCC) 08/19/2019   NICHD: Category 1  Membranes:  Intact, no s/s of infection  Induction:    Cytotec x1 dose at 1412, no more given due to fetal  intolerance.   Foley Bulb: inserted  0900 on 7/2  Pitocin - 16  Pain management:               IV pain management: xFentayl @ 2231, 0024, 0444,  0922, 1219, 0159, 0809             Epidural placement:  Request lab redraw for epidural  placement now   GBS Negative  GHTN: BP 117/89, asymptomatic, PCR undetectable, labs  unremarkable.    Plan: Continue labor plan Continuous monitoring Rest Ambulate Frequent position changes to facilitate fetal rotation and descent. Will reassess with cervical exam at 1300 or earlier if necessary Continue pitocin per protocol Anticipate foley coming out then breaking water.  Anticipate labor progression and vaginal delivery.   Md Dillard aware of plan and verbalized agreement.   1220, NP-C, CNM, MSN 08/20/2019. 10:41 AM

## 2019-08-20 NOTE — Progress Notes (Signed)
Labor Progress Note  Karen Christensen is a 29 y.o. female G2P1001 [redacted]w[redacted]d presenting for IOL for GHTN on 7/1, no meds, PCR was undectectable.  Denies HA, epigastric pain, neuro S/S today. Endorses active FM, denies LOF and vaginal bleeding. Hx SVD 58yrs ago, pelvis proven to 6#12oz. FOB has a hx of spina bifida resolved via surgery as a child. H/O Thalassemia.   Subjective: Pt in bed still tearful and feeling cxt, pt endorses she never got her epidural because she felt the fentanyl made her sleep better although she is tearful and endorses she can not sleep. Pt desires epidural now.  Patient Active Problem List   Diagnosis Date Noted   Gestational hypertension 08/19/2019   Alpha thalassemia trait 08/19/2019   Anemia 08/19/2019   BMI 50.0-59.9, adult (HCC) 08/19/2019   Objective: BP 138/70    Pulse 86    Temp 98 F (36.7 C) (Oral)    Resp 16    Ht 5\' 5"  (1.651 m)    Wt (!) 152 kg    LMP 12/05/2018    BMI 55.75 kg/m  No intake/output data recorded. No intake/output data recorded. NST: FHR baseline 125 bpm, Variability: moderate, Accelerations:present, Decelerations:  Absent= Cat 1/Reactive CTX:  irregular, every 2-5 minutes, lasting 20-50 seconds Uterus gravid, soft non tender, mild to palpate with contractions.  SVE:  Dilation: 3 Effacement (%): 60 Station: Ballotable Exam by:: [email or web address] CNM Pitocin at (16) mUn/min  Foley out with bloody show Unable to palpate fetal present part due to posterior cervix, bedside [employer] performed vertex noted.   Assessment:  Karen Christensen is a 29 y.o. female G2P1001 [redacted]w[redacted]d presenting for IOL for GHTN on 7/1, no meds, PCR was undectectable.  Denies HA, epigastric pain, neuro S/S today. Endorses active FM, denies LOF and vaginal bleeding. Hx SVD 34yrs ago, pelvis proven to 6#12oz. FOB has a hx of spina bifida resolved via surgery as a child. H/O Thalassemia.  Foley out now, pt progressing early labor Patient Active Problem List   Diagnosis Date Noted    Gestational hypertension 08/19/2019   Alpha thalassemia trait 08/19/2019   Anemia 08/19/2019   BMI 50.0-59.9, adult (HCC) 08/19/2019   NICHD: Category 1  Membranes:  Intact, no s/s of infection  Induction:    Cytotec x1 dose at 1412, no more given due to fetal  intolerance.   Foley Bulb: inserted  0900 on 7/2, out at 1300  Pitocin - 16  Pain management:               IV pain management: xFentayl @ 2231, 0024, 0444,  0922, 1219, 0159, 0809             Epidural placement:  Request lab redraw for epidural  placement now   GBS Negative  GHTN: BP 138/70, asymptomatic, PCR undetectable, labs  unremarkable.    Plan: Continue labor plan Continuous monitoring Rest Ambulate Frequent position changes to facilitate fetal rotation and descent. Will reassess with cervical exam at 1700 or earlier if necessary Continue pitocin per protocol Anticipate foley coming out then breaking water.  Anticipate labor progression and vaginal delivery.   Md Dillard aware of plan and verbalized agreement.   2232, NP-C, CNM, MSN 08/20/2019. 2:05 PM

## 2019-08-20 NOTE — Progress Notes (Signed)
Subjective:    Coping well w/ ctx.  Objective:    VS: BP 125/72   Pulse 79   Temp 98.5 F (36.9 C) (Oral)   Resp 20   Ht 5\' 5"  (1.651 m)   Wt (!) 152 kg   LMP 12/05/2018   BMI 55.75 kg/m  FHR : baseline 125 / variability moderate / accelerations present / absent decelerations Toco: contractions every 2-4 minutes Membranes: intact Dilation: 2 Effacement (%): 60 Cervical Position: Posterior Station: -3 Presentation: Vertex Exam by:: [email or web address], CNM  Assessment/Plan:   29 y.o. G2P1001 [redacted]w[redacted]d IOL for GHTN  Labor: Progressing on Pitocin, will continue to increase then AROM Preeclampsia:  no signs or symptoms of toxicity Fetal Wellbeing:  Category I Pain Control:  Labor support without medications I/D:  GBS negative Anticipated MOD:  NSVD  [redacted]w[redacted]d MSN, CNM 08/20/2019 4:20 AM

## 2019-08-20 NOTE — Progress Notes (Signed)
Tracing reviewed remotely by me Cat 1 and contracting regularly Will cont to observe

## 2019-08-20 NOTE — Progress Notes (Signed)
Labor Progress Note  Karen Christensen is a 29 y.o. female G2P1001 [redacted]w[redacted]d presenting for IOL for GHTN on 7/1, no meds, PCR was undectectable.  Denies HA, epigastric pain, neuro S/S today. Endorses active FM, denies LOF and vaginal bleeding. Hx SVD 63yrs ago, pelvis proven to 6#12oz. FOB has a hx of spina bifida resolved via surgery as a child. H/O Thalassemia.   Subjective: Pt resting comfortably.  Patient Active Problem List   Diagnosis Date Noted  . Gestational hypertension 08/19/2019  . Alpha thalassemia trait 08/19/2019  . Anemia 08/19/2019  . BMI 50.0-59.9, adult (HCC) 08/19/2019   Objective: BP (!) 100/52   Pulse 90   Temp 99.5 F (37.5 C) (Oral)   Resp 16   Ht 5\' 5"  (1.651 m)   Wt (!) 152 kg   LMP 12/05/2018   SpO2 100%   BMI 55.75 kg/m  No intake/output data recorded. Total I/O In: -  Out: 850 [Urine:850] NST: FHR baseline 135 bpm, Variability: moderate, Accelerations:present, Decelerations:  Absent= Cat 2/Reactive CTX:  irregular, every 3-4 minutes, lasting 50-70 seconds Uterus gravid, soft non tender, mild to palpate with contractions.  SVE:  Dilation: 8 Effacement (%): 90 Station: -3 Exam by:: [email or web address], RN Pitocin at (26) mUn/min   Assessment:  Karen Christensen is a 29 y.o. female G2P1001 [redacted]w[redacted]d presenting for IOL for GHTN on 7/1, no meds, PCR was undectectable.  Denies HA, epigastric pain, neuro S/S today. Endorses active FM, denies LOF and vaginal bleeding. Hx SVD 13yrs ago, pelvis proven to 6#12oz. FOB has a hx of spina bifida resolved via surgery as a child. H/O Thalassemia. Progressing in active labor with pit of 26.d.  Patient Active Problem List   Diagnosis Date Noted  . Gestational hypertension 08/19/2019  . Alpha thalassemia trait 08/19/2019  . Anemia 08/19/2019  . BMI 50.0-59.9, adult (HCC) 08/19/2019   NICHD: Category 2  Variables noted, continuous, position change,  bolus, amnio started.  Membranes:  AROM, clear 7/2 @ 1730, no s/s of  infection  Induction:    Cytotec x1 dose at 1412, no more given due to fetal  intolerance.   Foley Bulb: inserted  0900 on 7/2, out at 1300  Pitocin - 26  IUPC: MVUs: 220s, labor adaquate  Pain management:               IV pain management: xFentayl @ 2231, 0024, 0444,  0922, 1219, 0159, 0809             Epidural placement:  Placed 7/2 @ 1442  GBS Negative  GHTN: BP 117/68, asymptomatic, PCR undetectable, labs  unremarkable.    Plan: Continue labor plan Continuous monitoring Rest Frequent position changes to facilitate fetal rotation and descent. Will reassess with cervical exam when necessary Continue pitocin per protocol Anticipate foley coming out then breaking water.  Start amnioinfusion at 9/2 bolus and 114ml/hr continuous.  Anticipate labor progression and vaginal delivery.    45m, NP-C, CNM, MSN 08/20/2019. 9:30 PM

## 2019-08-20 NOTE — Anesthesia Preprocedure Evaluation (Deleted)
Anesthesia Evaluation  Patient identified by MRN, date of birth, ID band Patient awake    Reviewed: Allergy & Precautions, NPO status , Patient's Chart, lab work & pertinent test results  Airway Mallampati: III  TM Distance: >3 FB Neck ROM: Full    Dental no notable dental hx. (+) Teeth Intact   Pulmonary neg pulmonary ROS,    Pulmonary exam normal breath sounds clear to auscultation       Cardiovascular hypertension, Normal cardiovascular exam Rhythm:Regular Rate:Normal     Neuro/Psych negative neurological ROS  negative psych ROS   GI/Hepatic Neg liver ROS, GERD  ,  Endo/Other  Morbid obesity  Renal/GU negative Renal ROS  negative genitourinary   Musculoskeletal negative musculoskeletal ROS (+)   Abdominal (+) + obese,   Peds  Hematology  (+) anemia ,   Anesthesia Other Findings   Reproductive/Obstetrics (+) Pregnancy Gestational HTN                             Anesthesia Physical  Anesthesia Plan  ASA: III  Anesthesia Plan: Epidural   Post-op Pain Management:    Induction:   PONV Risk Score and Plan:   Airway Management Planned: Natural Airway  Additional Equipment:   Intra-op Plan:   Post-operative Plan:   Informed Consent: I have reviewed the patients History and Physical, chart, labs and discussed the procedure including the risks, benefits and alternatives for the proposed anesthesia with the patient or authorized representative who has indicated his/her understanding and acceptance.       Plan Discussed with: Anesthesiologist  Anesthesia Plan Comments:         Anesthesia Quick Evaluation  

## 2019-08-21 ENCOUNTER — Telehealth (HOSPITAL_COMMUNITY): Payer: Self-pay

## 2019-08-21 ENCOUNTER — Encounter (HOSPITAL_COMMUNITY): Payer: Self-pay | Admitting: Obstetrics and Gynecology

## 2019-08-21 LAB — COMPREHENSIVE METABOLIC PANEL
ALT: 10 U/L (ref 0–44)
AST: 13 U/L — ABNORMAL LOW (ref 15–41)
Albumin: 2.1 g/dL — ABNORMAL LOW (ref 3.5–5.0)
Alkaline Phosphatase: 82 U/L (ref 38–126)
Anion gap: 7 (ref 5–15)
BUN: 5 mg/dL — ABNORMAL LOW (ref 6–20)
CO2: 23 mmol/L (ref 22–32)
Calcium: 8.6 mg/dL — ABNORMAL LOW (ref 8.9–10.3)
Chloride: 107 mmol/L (ref 98–111)
Creatinine, Ser: 0.55 mg/dL (ref 0.44–1.00)
GFR calc Af Amer: >60 mL/min (ref >60)
GFR calc non Af Amer: >60 mL/min (ref >60)
Glucose, Bld: 138 mg/dL — ABNORMAL HIGH (ref 70–99)
Potassium: 3.5 mmol/L (ref 3.5–5.1)
Sodium: 137 mmol/L (ref 135–145)
Total Bilirubin: 0.8 mg/dL (ref 0.3–1.2)
Total Protein: 5.6 g/dL — ABNORMAL LOW (ref 6.5–8.1)

## 2019-08-21 LAB — CBC WITH DIFFERENTIAL/PLATELET
Abs Immature Granulocytes: 0.06 10*3/uL (ref 0.00–0.07)
Basophils Absolute: 0 10*3/uL (ref 0.0–0.1)
Basophils Relative: 0 %
Eosinophils Absolute: 0 10*3/uL (ref 0.0–0.5)
Eosinophils Relative: 0 %
HCT: 30.9 % — ABNORMAL LOW (ref 36.0–46.0)
Hemoglobin: 9.8 g/dL — ABNORMAL LOW (ref 12.0–15.0)
Immature Granulocytes: 1 %
Lymphocytes Relative: 10 %
Lymphs Abs: 1.3 10*3/uL (ref 0.7–4.0)
MCH: 26.1 pg (ref 26.0–34.0)
MCHC: 31.7 g/dL (ref 30.0–36.0)
MCV: 82.2 fL (ref 80.0–100.0)
Monocytes Absolute: 0.7 10*3/uL (ref 0.1–1.0)
Monocytes Relative: 5 %
Neutro Abs: 11.1 10*3/uL — ABNORMAL HIGH (ref 1.7–7.7)
Neutrophils Relative %: 84 %
Platelets: 324 10*3/uL (ref 150–400)
RBC: 3.76 MIL/uL — ABNORMAL LOW (ref 3.87–5.11)
RDW: 14.3 % (ref 11.5–15.5)
WBC: 13.2 10*3/uL — ABNORMAL HIGH (ref 4.0–10.5)
nRBC: 0 % (ref 0.0–0.2)

## 2019-08-21 MED ORDER — PRENATAL MULTIVITAMIN CH
1.0000 | ORAL_TABLET | Freq: Every day | ORAL | Status: DC
  Administered 2019-08-21: 1 via ORAL
  Filled 2019-08-21: qty 1

## 2019-08-21 MED ORDER — COCONUT OIL OIL: 1.0000 "application " | TOPICAL_OIL | Status: DC | PRN

## 2019-08-21 MED ORDER — DIBUCAINE (PERIANAL) 1 % EX OINT: 1.0000 "application " | TOPICAL_OINTMENT | CUTANEOUS | Status: DC | PRN

## 2019-08-21 MED ORDER — DIPHENHYDRAMINE HCL 25 MG PO CAPS: 25.0000 mg | ORAL_CAPSULE | Freq: Four times a day (QID) | ORAL | Status: DC | PRN

## 2019-08-21 MED ORDER — ONDANSETRON HCL 4 MG PO TABS: 4.0000 mg | ORAL_TABLET | ORAL | Status: DC | PRN

## 2019-08-21 MED ORDER — ZOLPIDEM TARTRATE 5 MG PO TABS: 5.0000 mg | ORAL_TABLET | Freq: Every evening | ORAL | Status: DC | PRN

## 2019-08-21 MED ORDER — WITCH HAZEL-GLYCERIN EX PADS: 1.0000 "application " | MEDICATED_PAD | CUTANEOUS | Status: DC | PRN

## 2019-08-21 MED ORDER — ONDANSETRON HCL 4 MG/2ML IJ SOLN: 4.0000 mg | INTRAMUSCULAR | Status: DC | PRN

## 2019-08-21 MED ORDER — IBUPROFEN 600 MG PO TABS
600.0000 mg | ORAL_TABLET | Freq: Four times a day (QID) | ORAL | Status: DC
  Administered 2019-08-21 – 2019-08-22 (×5): 600 mg via ORAL
  Filled 2019-08-21 (×5): qty 1

## 2019-08-21 MED ORDER — TETANUS-DIPHTH-ACELL PERTUSSIS 5-2.5-18.5 LF-MCG/0.5 IM SUSP: 0.5000 mL | Freq: Once | INTRAMUSCULAR | Status: DC

## 2019-08-21 MED ORDER — BENZOCAINE-MENTHOL 20-0.5 % EX AERO
1.0000 "application " | INHALATION_SPRAY | CUTANEOUS | Status: DC | PRN
  Administered 2019-08-21: 1 via TOPICAL
  Filled 2019-08-21: qty 56

## 2019-08-21 MED ORDER — SIMETHICONE 80 MG PO CHEW: 80.0000 mg | CHEWABLE_TABLET | ORAL | Status: DC | PRN

## 2019-08-21 MED ORDER — ACETAMINOPHEN 325 MG PO TABS
650.0000 mg | ORAL_TABLET | ORAL | Status: DC | PRN
  Administered 2019-08-21 – 2019-08-22 (×2): 650 mg via ORAL
  Filled 2019-08-21 (×2): qty 2

## 2019-08-21 MED ORDER — POLYSACCHARIDE IRON COMPLEX 150 MG PO CAPS
150.0000 mg | ORAL_CAPSULE | Freq: Every day | ORAL | Status: DC
  Administered 2019-08-21 – 2019-08-22 (×2): 150 mg via ORAL
  Filled 2019-08-21 (×2): qty 1

## 2019-08-21 MED ORDER — SENNOSIDES-DOCUSATE SODIUM 8.6-50 MG PO TABS
2.0000 | ORAL_TABLET | ORAL | Status: DC
  Administered 2019-08-21: 2 via ORAL
  Filled 2019-08-21: qty 2

## 2019-08-21 NOTE — Anesthesia Postprocedure Evaluation (Signed)
Anesthesia Post Note  Patient: Karen Christensen  Procedure(s) Performed: AN AD HOC LABOR EPIDURAL     Patient location during evaluation: Mother Baby Anesthesia Type: Epidural Level of consciousness: awake and alert Pain management: pain level controlled Vital Signs Assessment: post-procedure vital signs reviewed and stable Respiratory status: spontaneous breathing, nonlabored ventilation and respiratory function stable Cardiovascular status: stable Postop Assessment: no headache, no backache and epidural receding Anesthetic complications: no   No complications documented.  Last Vitals:  Vitals:   08/21/19 0946 08/21/19 1414  BP: 103/79 115/61  Pulse: 96 98  Resp: 18 18  Temp: 36.6 C 36.9 C  SpO2: 99% 100%    Last Pain:  Vitals:   08/21/19 1414  TempSrc: Oral  PainSc:    Pain Goal:                   [facility] Records

## 2019-08-21 NOTE — Lactation Note (Signed)
This note was copied from a baby's chart. Lactation Consultation Note  Patient Name: Karen Christensen IONGE'X Date: 08/21/2019   Initial visit at 9 hours of life. Mom's feeding choice is breast & formula. Mom is a P2 (1st child 29 yo). She denies breast changes with this and her 1st pregnancy. Mom reports that her milk didn't come in with her 1st child.   Infant has not fed since birth, although attempts at the breast have been made. I attempted to latch infant, but he was only interested in licking the nipple. "Kaiden's" tongue becomes heart-shaped with certain movements.  Formula was initiated with an extra-slow flow nipple with Mom's permission. It did take him a minute to learn how to nipple feed, but seemed to do OK once he figured out how to do it. I gave "Erskine Squibb" to Mom explaining why we were using extra-slow flow nipples (possible tongue restriction). Mom knows to inform us if there are any concerns with how he bottle feeds.   Mom knows to pump every time infant receives formula. She understands that she may not get any yield at this time, but it is needed for breast stimulation. Mom had been started with size 27 flanges, but I feel that size 24 flanges are more appropriate for her. I explained that to Mom & hooked them up to the pump. Mom knows she can return to the size 27 flanges if she felt that they were more comfortable.   Hand expression was taught to Mom. Nothing was yielded from the R breast; only a glisten was noted from the L breast.   Lurline Hare [facility] 08/21/2019, 10:47 AM

## 2019-08-22 MED ORDER — ACETAMINOPHEN 325 MG PO TABS: 650.0000 mg | ORAL_TABLET | ORAL | 0 refills | Status: AC | PRN

## 2019-08-22 MED ORDER — IBUPROFEN 600 MG PO TABS: 600.0000 mg | ORAL_TABLET | Freq: Four times a day (QID) | ORAL | 0 refills | Status: AC

## 2019-08-22 NOTE — Plan of Care (Signed)
  Problem: Education: Goal: Knowledge of General Education information will improve Description: Including pain rating scale, medication(s)/side effects and non-pharmacologic comfort measures Outcome: Completed/Met   Problem: Clinical Measurements: Goal: Ability to maintain clinical measurements within normal limits will improve Outcome: Completed/Met Goal: Will remain free from infection Outcome: Completed/Met Goal: Diagnostic test results will improve Outcome: Completed/Met Goal: Respiratory complications will improve Outcome: Completed/Met Goal: Cardiovascular complication will be avoided Outcome: Completed/Met   Problem: Activity: Goal: Risk for activity intolerance will decrease Outcome: Completed/Met   Problem: Nutrition: Goal: Adequate nutrition will be maintained Outcome: Completed/Met   Problem: Pain Managment: Goal: General experience of comfort will improve Outcome: Completed/Met   Problem: Safety: Goal: Ability to remain free from injury will improve Outcome: Completed/Met   Problem: Skin Integrity: Goal: Risk for impaired skin integrity will decrease Outcome: Completed/Met   Problem: Education: Goal: Knowledge of General Education information will improve Description: Including pain rating scale, medication(s)/side effects and non-pharmacologic comfort measures Outcome: Completed/Met   Problem: Clinical Measurements: Goal: Ability to maintain clinical measurements within normal limits will improve Outcome: Completed/Met Goal: Will remain free from infection Outcome: Completed/Met Goal: Diagnostic test results will improve Outcome: Completed/Met Goal: Respiratory complications will improve Outcome: Completed/Met Goal: Cardiovascular complication will be avoided Outcome: Completed/Met   Problem: Activity: Goal: Risk for activity intolerance will decrease Outcome: Completed/Met

## 2019-08-22 NOTE — Plan of Care (Signed)
  Problem: Education: Goal: Knowledge of General Education information will improve Description: Including pain rating scale, medication(s)/side effects and non-pharmacologic comfort measures Outcome: Completed/Met   Problem: Clinical Measurements: Goal: Ability to maintain clinical measurements within normal limits will improve Outcome: Completed/Met Goal: Will remain free from infection Outcome: Completed/Met Goal: Diagnostic test results will improve Outcome: Completed/Met Goal: Respiratory complications will improve Outcome: Completed/Met Goal: Cardiovascular complication will be avoided Outcome: Completed/Met   Problem: Activity: Goal: Risk for activity intolerance will decrease Outcome: Completed/Met   Problem: Nutrition: Goal: Adequate nutrition will be maintained Outcome: Completed/Met   Problem: Pain Managment: Goal: General experience of comfort will improve Outcome: Completed/Met   Problem: Safety: Goal: Ability to remain free from injury will improve Outcome: Completed/Met   Problem: Skin Integrity: Goal: Risk for impaired skin integrity will decrease Outcome: Completed/Met   Problem: Education: Goal: Knowledge of General Education information will improve Description: Including pain rating scale, medication(s)/side effects and non-pharmacologic comfort measures Outcome: Completed/Met   Problem: Clinical Measurements: Goal: Ability to maintain clinical measurements within normal limits will improve Outcome: Completed/Met Goal: Will remain free from infection Outcome: Completed/Met Goal: Diagnostic test results will improve Outcome: Completed/Met Goal: Respiratory complications will improve Outcome: Completed/Met Goal: Cardiovascular complication will be avoided Outcome: Completed/Met   Problem: Activity: Goal: Risk for activity intolerance will decrease Outcome: Completed/Met   Problem: Education: Goal: Knowledge of condition will  improve Outcome: Completed/Met   Problem: Activity: Goal: Will verbalize the importance of balancing activity with adequate rest periods Outcome: Completed/Met   Problem: Life Cycle: Goal: Chance of risk for complications during the postpartum period will decrease Outcome: Completed/Met

## 2019-08-22 NOTE — Discharge Summary (Signed)
SVD OB Discharge Summary     Patient Name: Karen Christensen DOB: [birth year] MRN: [id number]  Date of admission: 08/19/2019 Delivering MD: Dale Lockhart  Date of delivery: 08/21/2019 Type of delivery: SVD  Newborn Data: Sex: Baby Female Circumcision: out pt desired Live born female  Birth Weight: 6 lb 4.2 oz (2841 g) APGAR: 9, 9  Newborn Delivery   Birth date/time: 08/21/2019 01:39:00 Delivery type: Vaginal, Spontaneous      Feeding: breast and bottle Infant being discharge to home with mother in stable condition.   Admitting diagnosis: Gestational hypertension [O13.9] Intrauterine pregnancy: [redacted]w[redacted]d     Secondary diagnosis:  Principal Problem:   Postpartum care following vaginal delivery 7/3 Active Problems:   Gestational hypertension   Alpha thalassemia trait   Anemia   BMI 50.0-59.9, adult (HCC)   SVD (spontaneous vaginal delivery)                                Complications: None                                                              Intrapartum Procedures: spontaneous vaginal delivery Postpartum Procedures: none Complications-Operative and Postpartum: none Augmentation: AROM, Pitocin, Cytotec and IP Foley   History of Present Illness: Ms. Karen Christensen is a 29 y.o. female, G2P2002, who presents at [redacted]w[redacted]d weeks gestation. The patient has been followed at  [facility] Gyne[facility]  Her pregnancy has been complicated by:  Patient Active Problem List   Diagnosis Date Noted  . SVD (spontaneous vaginal delivery) 08/21/2019  . Postpartum care following vaginal delivery 7/3 08/21/2019  . Gestational hypertension 08/19/2019  . Alpha thalassemia trait 08/19/2019  . Anemia 08/19/2019  . BMI 50.0-59.9, adult [facility]) 08/19/2019    Hospital course:  Induction of Labor With Vaginal Delivery   29 y.o. yo G2P2002 at [redacted]w[redacted]d was admitted to the hospital 08/19/2019 for induction of labor.  Indication for induction @ 39.2: Gestational hypertension and PCR was  undected, no meds required, thalamesia trait, BMI of 55, pt proceeded to have a SVD on 7/3 with 1 dose cytotec the had decels, proceeded with pitocin, then foley, and AROM.Marland Kitchen  Patient had an uncomplicated labor course as follows: Membrane Rupture Time/Date: 5:55 PM ,08/20/2019   Delivery Method:Vaginal, Spontaneous  Episiotomy: None  Lacerations:  None  Details of delivery can be found in separate delivery note.  Patient had a routine postpartum course. Patient is discharged home 08/22/19.  Newborn Data: Birth date:08/21/2019  Birth time:1:39 AM  Gender:Female  Living status:Living  Apgars:9 ,9  Weight:2841 g  Postpartum Day # 1 : S/P NSVD due to IOL GHTN: denies HA, RUQ pain or vision changes.. Patient up ad lib, denies syncope or dizziness. Reports consuming regular diet without issues and denies N/V. Patient reports 0 bowel movement + passing flatus.  Denies issues with urination and reports bleeding is "lighter."  Patient is breast and bottle feeding and reports going well.  Desires undecided for postpartum contraception.  Pain is being appropriately managed with use of po meds. Pt verbalized she will need a 1 week BP check due to [facility], Current normotensive BP  112/63.  Pt has asymptomatic anemia with hgb drop from 9.9-9.8  on iron, EBL was .   Physical exam  Vitals:   08/21/19 1414 08/21/19 1746 08/21/19 2024 08/22/19 0515  BP: 115/61 122/76 (!) 109/59 112/63  Pulse: 98  99 (!) 58  Resp: 18 18 18 18   Temp: 98.5 F (36.9 C) 97.8 F (36.6 C) 98.3 F (36.8 C) 97.8 F (36.6 C)  TempSrc: Oral Oral Oral Oral  SpO2: 100% 98%    Weight:      Height:       General: alert, cooperative and no distress Lochia: appropriate Uterine Fundus: firm Incision: Healing well with no significant drainage, No significant erythema, Dressing is clean, dry, and intact, honeycomb dressing CDI Perineum: Intact DVT Evaluation: No evidence of DVT seen on physical exam. Negative Homan's sign. No cords or  calf tenderness. No significant calf/ankle edema. 2+ Patellar DTR, No clonus   Labs: Lab Results  Component Value Date   WBC 13.2 (H) 08/21/2019   HGB 9.8 (L) 08/21/2019   HCT 30.9 (L) 08/21/2019   MCV 82.2 08/21/2019   PLT 324 08/21/2019   CMP Latest Ref Rng & Units 08/21/2019  Glucose 70 - 99 mg/dL 10/22/2019)  BUN 6 - 20 mg/dL 5(L)  Creatinine 882(C - 1.00 mg/dL 0.03  Sodium 4.91 - 791 mmol/L 137  Potassium 3.5 - 5.1 mmol/L 3.5  Chloride 98 - 111 mmol/L 107  CO2 22 - 32 mmol/L 23  Calcium 8.9 - 10.3 mg/dL 505)  Total Protein 6.5 - 8.1 g/dL 6.9(V)  Total Bilirubin 0.3 - 1.2 mg/dL 0.8  Alkaline Phos 38 - 126 U/L 82  AST 15 - 41 U/L 13(L)  ALT 0 - 44 U/L 10    Date of discharge: 08/22/2019 Discharge Diagnoses: Term Pregnancy-delivered Discharge instruction: per After Visit Summary and "Baby and Me Booklet".  After visit meds:   Activity:           unrestricted and pelvic rest Advance as tolerated. Pelvic rest for 6 weeks.  Diet:                routine Medications: PNV, Colace, Iron and tylenol, and motirn  Postpartum contraception: Undecided Condition:  Pt discharge to home with baby in stable GHTN: Monitor BP, no meds required currently bp normotensive. 1 week BP check, report s/sx of preE.  Anemia: PO Iron daily  Meds: Allergies as of 08/22/2019      Reactions   Bee Pollen Rash      Medication List    STOP taking these medications   aspirin EC 81 MG tablet     TAKE these medications   acetaminophen 325 MG tablet Commonly known as: Tylenol Take 2 tablets (650 mg total) by mouth every 4 (four) hours as needed (for pain scale < 4). What changed:   when to take this  reasons to take this   ibuprofen 600 MG tablet Commonly known as: ADVIL Take 1 tablet (600 mg total) by mouth every 6 (six) hours.   PRENATAL GUMMIES/DHA & FA PO Take 1 tablet by mouth daily.       Discharge Follow Up:   Follow-up Information    [facility]  Follow up.   Specialty: Obstetrics and Gynecology Why: 1 week BP check and 6 weeks PPV Contact information: [address] [place] [phone]               [place], NP-C, CNM 08/22/2019, 10:24 AM  10/23/2019, FNP

## 2019-08-24 ENCOUNTER — Encounter (HOSPITAL_COMMUNITY): Payer: Self-pay | Admitting: Obstetrics & Gynecology

## 2019-08-24 ENCOUNTER — Inpatient Hospital Stay (HOSPITAL_COMMUNITY)
Admission: AD | Admit: 2019-08-24 | Discharge: 2019-08-24 | Disposition: A | Discharge status: Home / Self Care | Payer: 59 | Attending: Obstetrics & Gynecology | Admitting: Obstetrics & Gynecology

## 2019-08-24 ENCOUNTER — Other Ambulatory Visit: Payer: Self-pay

## 2019-08-24 DIAGNOSIS — O9089 Other complications of the puerperium, not elsewhere classified: Secondary | ICD-10-CM | POA: Insufficient documentation

## 2019-08-24 DIAGNOSIS — O133 Gestational [pregnancy-induced] hypertension without significant proteinuria, third trimester: Secondary | ICD-10-CM

## 2019-08-24 DIAGNOSIS — R5081 Fever presenting with conditions classified elsewhere: Secondary | ICD-10-CM | POA: Diagnosis not present

## 2019-08-24 DIAGNOSIS — O135 Gestational [pregnancy-induced] hypertension without significant proteinuria, complicating the puerperium: Secondary | ICD-10-CM | POA: Insufficient documentation

## 2019-08-24 DIAGNOSIS — O9279 Other disorders of lactation: Secondary | ICD-10-CM | POA: Insufficient documentation

## 2019-08-24 DIAGNOSIS — Z9189 Other specified personal risk factors, not elsewhere classified: Secondary | ICD-10-CM

## 2019-08-24 DIAGNOSIS — R519 Headache, unspecified: Secondary | ICD-10-CM | POA: Diagnosis not present

## 2019-08-24 DIAGNOSIS — O864 Pyrexia of unknown origin following delivery: Secondary | ICD-10-CM

## 2019-08-24 LAB — URINALYSIS, ROUTINE W REFLEX MICROSCOPIC
Bilirubin Urine: NEGATIVE
Glucose, UA: NEGATIVE mg/dL
Ketones, ur: NEGATIVE mg/dL
Nitrite: NEGATIVE
Protein, ur: 30 mg/dL — AB
RBC / HPF: >50 RBC/hpf — ABNORMAL HIGH (ref 0–5)
Specific Gravity, Urine: 1.024 (ref 1.005–1.030)
WBC, UA: >50 WBC/hpf — ABNORMAL HIGH (ref 0–5)
pH: 5 (ref 5.0–8.0)

## 2019-08-24 NOTE — Discharge Instructions (Signed)
Breast Engorgement Breast engorgement is the overfilling of your breasts with breast milk. It is usually caused by delaying feedings, which can cause milk to build up. Breast engorgement can happen at any time while you are breast feeding, and is normal in the first 3-5 days after giving birth. The condition can make your breasts feel heavy, full, hard, tightly stretched, warm, and tender. Breast engorgement should improve within 24-48 hours of feeding your baby or expressing your milk. Follow these instructions at home: When to pump  Use a breast pump to remove milk from your breasts when you feel the need to reduce the fullness of your breasts - every 3-4 hours during the day if continuing to pump  If you are returning to work or are away from home for an extended period, try to pump your milk on the same schedule Before breastfeeding or pumping:  Increase the circulation in your breasts and help your milk flow. Try either of these methods: ? Taking a warm shower. ? Applying warm, water-soaked hand towels to your breasts. ? Massaging your breasts.   hand-express breast milk before pumping to soften your breast, areola, and nipple. During breastfeeding or pumping:  Try to relax when it is time to pump. This helps to trigger your "let-down reflex," which releases milk from your breast.  Empty your breasts completely whenpumping.  Massage your breasts to help your milk flow. Managing pain and swelling   Take over-the-counter and prescription medicines only as told by your health care provider.  If directed, put ice on your breasts: ? Put ice in a plastic bag. ? Place a towel between your skin and the bag. ? Leave the ice on for 20 minutes, 2-3 times a day.  If you feel pain while breastfeeding, take your baby off your breast and try again. General instructions  After breastfeeding or pumping wear a snug bra or tank top for 1-2 days. This will signal your body to slightly decrease  how much milk it makes. Once the engorgement passes, make sure you to wear a well-fitted, supportive bra and regular clothes.  Drink enough fluid to keep your urine clear or pale yellow.  Avoid introducing bottles or pacifiers to your baby in the early weeks of breastfeeding. Wait to introduce these things until after resolving any breastfeeding challenges. Contact a health care provider if:  Engorgement lasts longer than 2 days, even after treatment.  You have flu-like symptoms, such as a fever, chills, or body aches.  You have nausea or you vomit.  Your breasts become red and painful.  You have a lump in your breast.  Your nipples continue to crack or start to ooze.  There is yellow discharge coming from a nipple.  You have pain while breastfeeding, and it does not go away once you take your baby off your breast and try again. Get help right away if:  There is pus or blood in your breast milk.  You have sudden, severe symptoms.  You have red streaks near your breast.  Both breasts appear infected and you cannot breastfeed. Summary  Breast engorgement is the overfilling of your breasts with breast milk. It is usually caused by delayed feeding.  Although it is normal to experience breast engorgement 3-5 days after giving birth, it can happen at any time while breastfeeding.  Do not delay pumpings, pump every 3-4 hrs to help prevent engorgement.  Increase the circulation in your breasts and help your milk flow before pumping. You  can do this by taking a warm shower, applying warm water-soaked hand towels, or massaging your breasts. This information is not intended to replace advice given to you by your health care provider. Make sure you discuss any questions you have with your health care provider. Document Revised: 01/17/2017 Document Reviewed: 03/11/2016 Elsevier Patient Education  2020 [employer].

## 2019-08-24 NOTE — MAU Provider Note (Signed)
History     CSN: [id number]  Arrival date and time: 08/24/19 1512   First Provider Initiated Contact with Patient 08/24/19 1620      Chief Complaint  Patient presents with  . Fever   Pt came in complaining of fever today (100.4) and mild headache. She is PPD #3 She denies increased lochia, abd/pelvic pain, or vaginal discharge. She is not breastfeeding, but says her breasts feel much fuller today.  Fever  This is a new problem. The current episode started today. Episode frequency: just today. The problem has been resolved. The maximum temperature noted was 100 to 100.9 F. The temperature was taken using an oral thermometer. Associated symptoms include headaches (mild). Pertinent negatives include no abdominal pain, chest pain, coughing, diarrhea, muscle aches, nausea or urinary pain. She has tried acetaminophen for the symptoms. The treatment provided significant relief.     Past Medical History:  Diagnosis Date  . Medical history non-contributory   . Pregnancy induced hypertension     Past Surgical History:  Procedure Laterality Date  . NO PAST SURGERIES      Family History  Problem Relation Age of Onset  . Asthma Sister   . Cancer Maternal Grandmother   . Cancer Paternal Grandmother   . Stroke Neg Hx   . Heart disease Neg Hx     Social History   Tobacco Use  . Smoking status: Never Smoker  . Smokeless tobacco: Never Used  Vaping Use  . Vaping Use: Never used  Substance Use Topics  . Alcohol use: No  . Drug use: No    Allergies:  Allergies  Allergen Reactions  . Bee Pollen Rash    No medications prior to admission.    Review of Systems  Constitutional: Positive for fever.  HENT: Negative.   Eyes: Negative for photophobia and visual disturbance.  Respiratory: Negative for cough.   Cardiovascular: Negative for chest pain.  Gastrointestinal: Negative for abdominal pain, diarrhea and nausea.  Endocrine: Negative.   Genitourinary: Positive for vaginal  bleeding (normal lochia, decreased steadily since hospital DC). Negative for decreased urine volume, dysuria, pelvic pain, urgency, vaginal discharge and vaginal pain.  Musculoskeletal: Negative.   Skin: Negative.   Neurological: Positive for headaches (mild). Negative for dizziness, syncope and light-headedness.  Hematological: Negative.   Psychiatric/Behavioral: Negative.    Physical Exam   Blood pressure 136/63, pulse 80, temperature 98.9 F (37.2 C), temperature source Oral, resp. rate 16, height 5\' 5"  (1.651 m), weight (!) 335 lb 12.8 oz (152.3 kg), last menstrual period 12/05/2018, SpO2 100 %, unknown if currently breastfeeding.  Physical Exam Vitals and nursing note reviewed.  Constitutional:      Appearance: Normal appearance.  HENT:     Head: Normocephalic.     Mouth/Throat:     Mouth: Mucous membranes are moist.  Eyes:     Pupils: Pupils are equal, round, and reactive to light.  Cardiovascular:     Rate and Rhythm: Normal rate and regular rhythm.     Pulses: Normal pulses.     Heart sounds: Normal heart sounds.  Pulmonary:     Effort: Pulmonary effort is normal.     Breath sounds: Normal breath sounds.  Abdominal:     General: Bowel sounds are normal.  Genitourinary:    Comments: Uterus -3 below the umbilicus and nontender Musculoskeletal:        General: Normal range of motion.     Cervical back: Normal range of motion.  Skin:  General: Skin is warm and dry.     Capillary Refill: Capillary refill takes less than 2 seconds.  Neurological:     Mental Status: She is alert and oriented to person, place, and time.  Psychiatric:        Mood and Affect: Mood normal.        Behavior: Behavior normal.        Thought Content: Thought content normal.        Judgment: Judgment normal.     MAU Course  MDM No complaints of urinary symptoms, uterine pain or vaginal discharge, lochia has decreased steadily. Breasts are warm and filling, not yet engorged. Milk flows  easily with hand expression. Pt not breastfeeding because it hurt too much to latch and she was unable to express milk using the breast pump. Said LCs changed her pump flange size which caused pumping to hurt as much as nursing, so she stopped all attempts at breastfeeding. Sized her up on flange size, demonstrated hand expression with good milk flow. Pt amenable to pumping again.  Assessment and Plan  Physiologic fever due to lactogenesis 2 Given instructions for pumping and drying up milk if pt desires Reviewed treatment for engorgment and mastitis precautions.  Follow up as scheduled with [facility]  Bernerd Limbo, CNM, IBCLC 08/24/2019, 5:39 PM

## 2019-08-24 NOTE — MAU Note (Signed)
Presents with c/o temp 100.4 and H/A.  Took Tylenol @ 1400.   S/P NVD on 08/21/2019, no complications.  Reports HTN during pregnancy.

## 2020-02-13 ENCOUNTER — Other Ambulatory Visit: Payer: Self-pay

## 2020-02-13 ENCOUNTER — Emergency Department (HOSPITAL_COMMUNITY)
Admission: EM | Admit: 2020-02-13 | Discharge: 2020-02-14 | Disposition: A | Discharge status: Home / Self Care | Payer: 59

## 2020-02-13 NOTE — ED Notes (Signed)
Pt left ED and did not want to be seen. States she did not feel comfortable.

## 2020-07-01 IMAGING — US US MFM OB DETAIL+14 WK
1 series · 13 of 28 positions shown · non-contrast
Comparison: none

[Series 1: us mfm ob detail+14 wk · 13 of 100 slices shown]
[im 4/100]
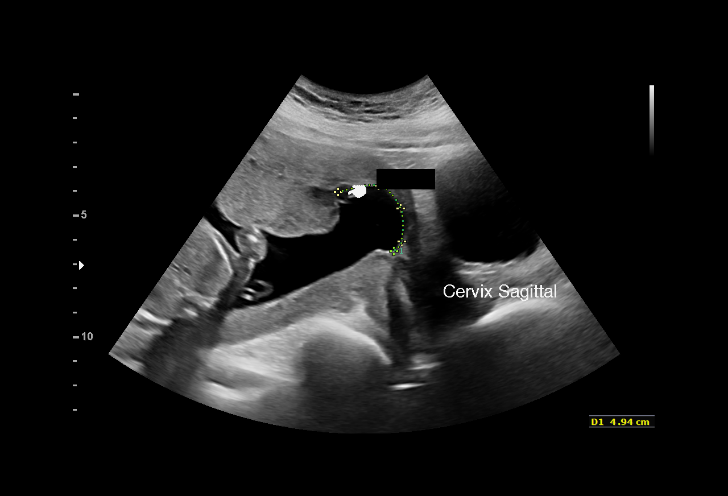
[im 12/100]
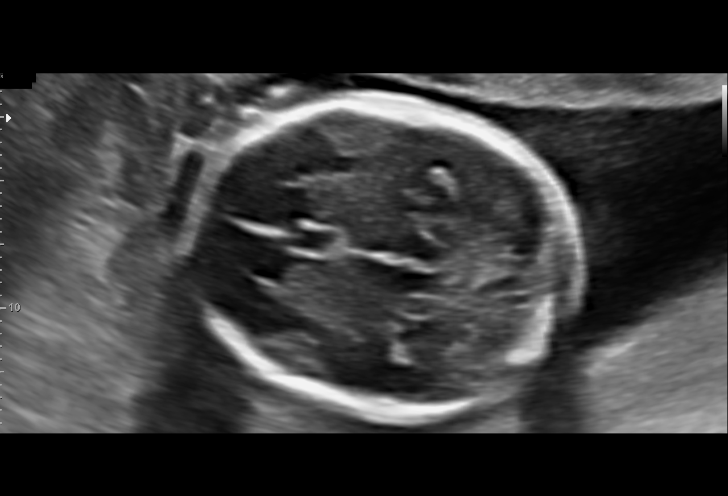
[im 19/100]
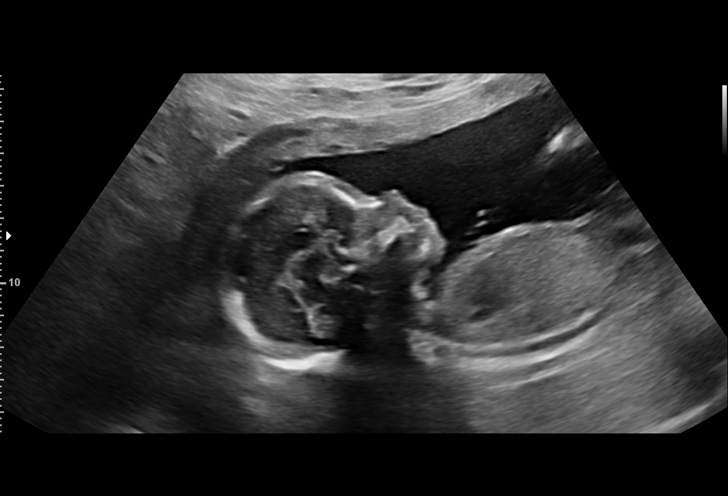
[im 26/100]
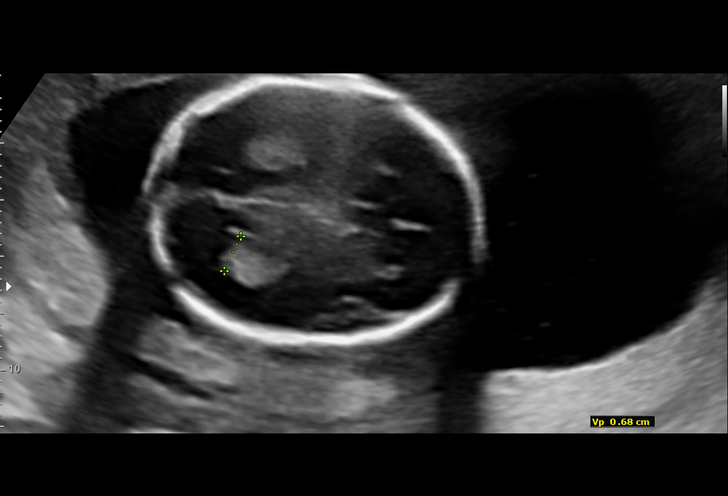
[im 34/100]
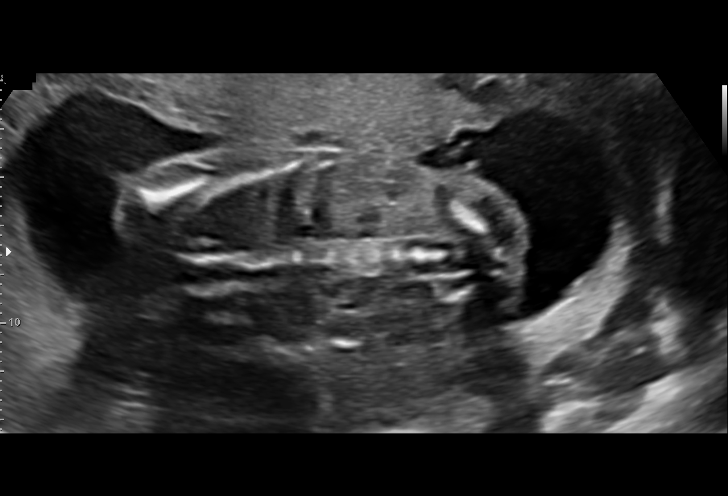
[im 41/100]
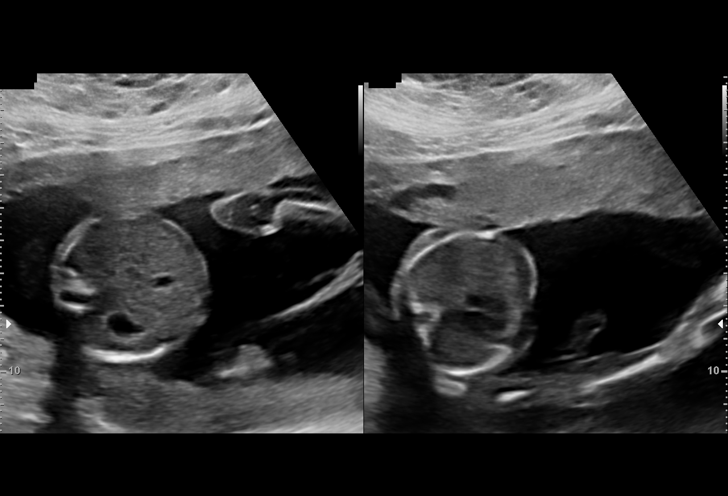
[im 52/100]
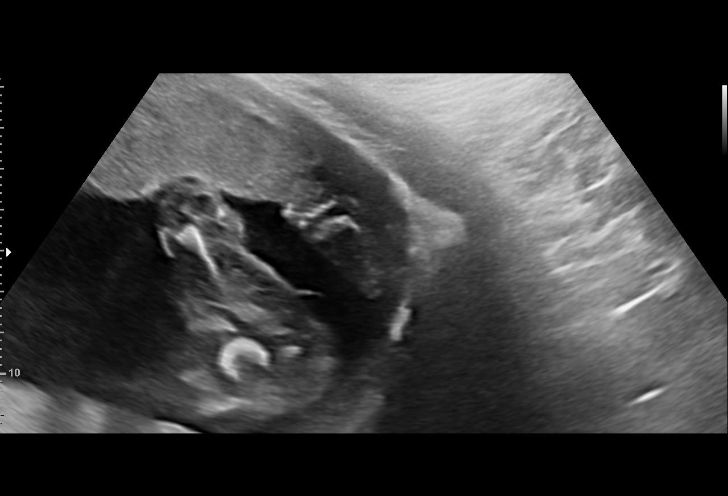
[im 59/100]
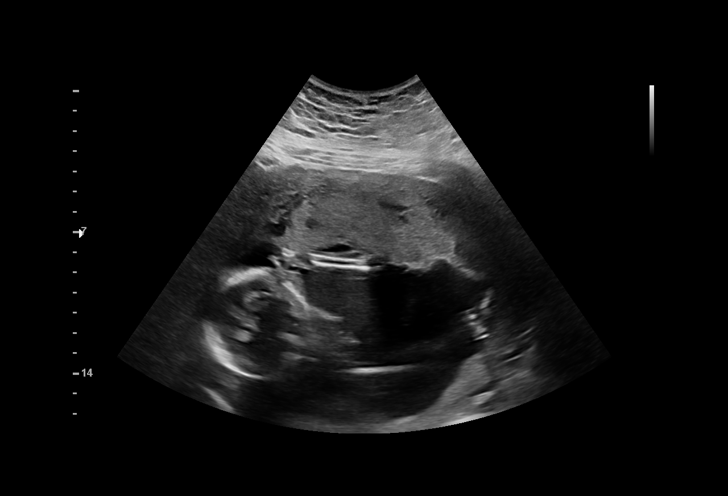
[im 67/100]
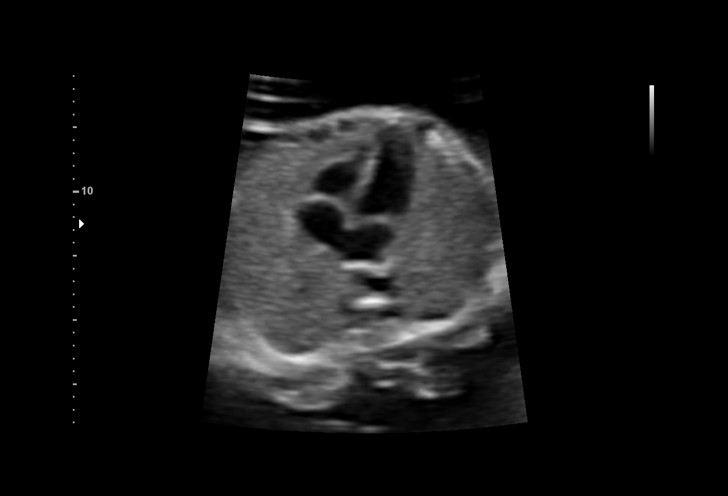
[im 74/100]
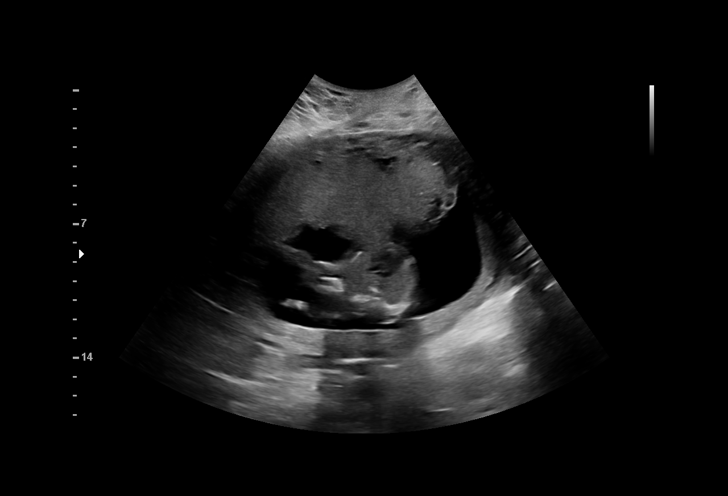
[im 81/100]
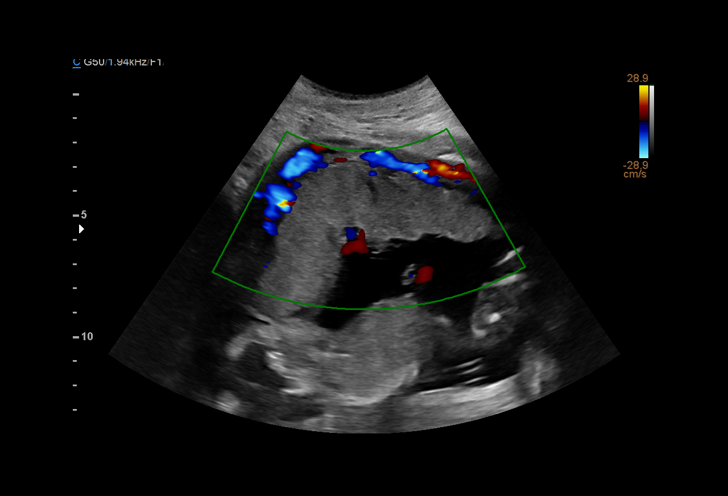
[im 89/100]
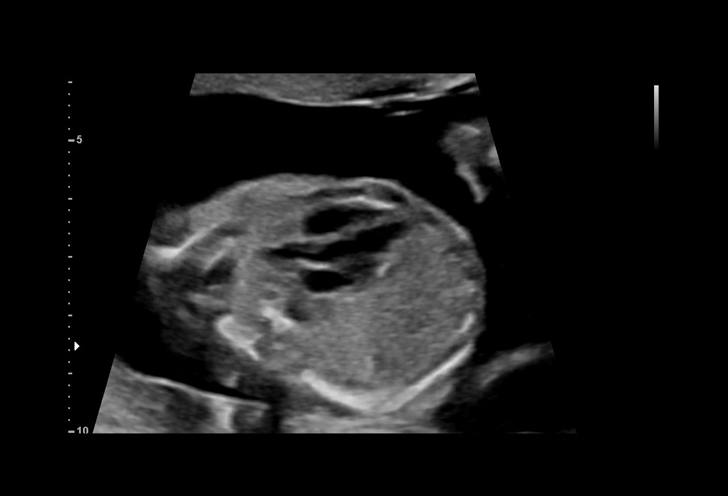
[im 96/100]
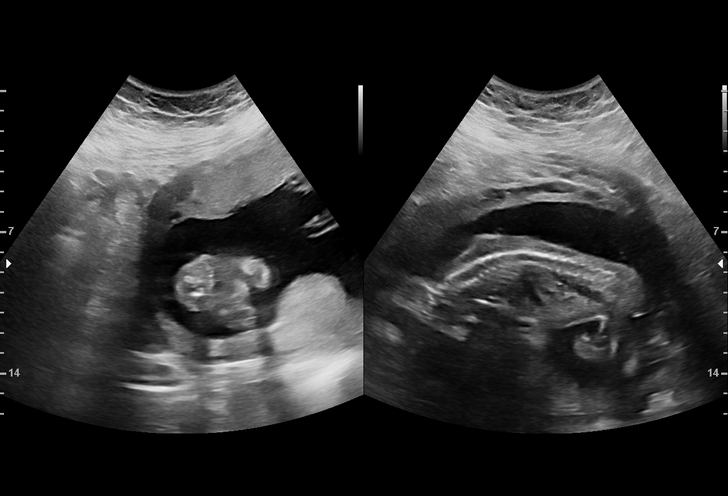

[13 of 28 positions shown; findings below may reference images not displayed]

Obstetrics &
                                                            Gynecology
                                                            [address]
                                                            [place].
                   CNM

 ----------------------------------------------------------------------

 ----------------------------------------------------------------------
Indications

  Maternal morbid obesity
  20 weeks gestation of pregnancy
  Encounter for antenatal screening for
  malformations
  Genetic carrier (Mirbala Akbarli trait)
  Covid +  03/19/19
 ----------------------------------------------------------------------
Vital Signs

                                                Height:        5'3"
Fetal Evaluation

 Num Of Fetuses:         1
 Fetal Heart Rate(bpm):  146
 Cardiac Activity:       Observed
 Presentation:           Transverse, head to maternal right
 Placenta:               Anterior
 P. Cord Insertion:      Visualized

 Amniotic Fluid
 AFI FV:      Within normal limits

                             Largest Pocket(cm)

Biometry

 BPD:      46.2  mm     G. Age:  20w 0d         25  %    CI:        71.49   %    70 - 86
                                                         FL/HC:      17.7   %    15.9 -
 HC:       174   mm     G. Age:  20w 0d         16  %    HC/AC:      1.13        1.06 -
 AC:      153.4  mm     G. Age:  20w 4d         42  %    FL/BPD:     66.7   %
 FL:       30.8  mm     G. Age:  19w 4d         12  %    FL/AC:      20.1   %    20 - 24
 HUM:      31.4  mm     G. Age:  20w 3d         46  %
 CER:      20.7  mm     G. Age:  19w 5d         30  %
 NFT:       5.1  mm
 LV:        6.8  mm
 CM:        4.7  mm

 Est. FW:     330  gm    0 lb 12 oz      21  %
OB History

 Gravidity:    2         Term:   1
 Living:       1
Gestational Age

 LMP:           18w 2d        Date:  12/05/18                 EDD:   09/11/19
 U/S Today:     20w 0d                                        EDD:   08/30/19
 Best:          20w 4d     Det. By:  Early Ultrasound         EDD:   08/26/19
Anatomy

 Cranium:               Appears normal         LVOT:                   Appears normal
 Cavum:                 Appears normal         Aortic Arch:            Appears normal
 Ventricles:            Appears normal         Ductal Arch:            Appears normal
 Choroid Plexus:        Appears normal         Diaphragm:              Appears normal
 Cerebellum:            Appears normal         Stomach:                Appears normal, left
                                                                       sided
 Posterior Fossa:       Appears normal         Abdomen:                Appears normal
 Nuchal Fold:           Appears normal         Abdominal Wall:         Appears nml (cord
                                                                       insert, abd wall)
 Face:                  Appears normal         Cord Vessels:           Appears normal (3
                        (orbits and profile)                           vessel cord)
 Lips:                  Appears normal         Kidneys:                Appear normal
 Palate:                Appears normal         Bladder:                Appears normal
 Thoracic:              Appears normal         Spine:                  Not well visualized
 Heart:                 Appears normal         Upper Extremities:      Appears normal
                        (4CH, axis, and
                        situs)
 RVOT:                  Not well visualized    Lower Extremities:      Appears normal

 Other:  Male gender. Heels and RT 5th digit visualized.  LT 5th digit not well
         visualized. Nasal bone visualized. Technically difficult due to maternal
         habitus and fetal position.
Cervix Uterus Adnexa

 Cervix
 Length:           3.78  cm.
 Normal appearance by transabdominal scan.

 Uterus
 No abnormality visualized.
 Left Ovary
 Within normal limits.

 Right Ovary
 Not visualized.
Comments

 This patient was seen for a detailed fetal anatomy scan due
 to maternal morbid obesity. She denies any significant past
 medical history and denies any problems in her current
 pregnancy.
 She was informed that the fetal growth and amniotic fluid
 level were appropriate for her gestational age.
 There were no obvious fetal anomalies noted on today's
 ultrasound exam.  However, the views of the fetal anatomy
 were limited today due to extreme maternal body habitus.
 The patient was informed that anomalies may be missed due
 to technical limitations. If the fetus is in a suboptimal position
 or maternal habitus is increased, visualization of the fetus in
 the maternal uterus may be impaired.
 A follow-up ultrasound was scheduled in 4 weeks to complete
 the views of the fetal anatomy.

## 2020-10-10 ENCOUNTER — Encounter (HOSPITAL_BASED_OUTPATIENT_CLINIC_OR_DEPARTMENT_OTHER): Payer: Self-pay

## 2020-10-10 ENCOUNTER — Emergency Department (HOSPITAL_BASED_OUTPATIENT_CLINIC_OR_DEPARTMENT_OTHER)
Admission: EM | Admit: 2020-10-10 | Discharge: 2020-10-10 | Disposition: A | Discharge status: Home / Self Care | Payer: 59 | Attending: Emergency Medicine | Admitting: Emergency Medicine

## 2020-10-10 ENCOUNTER — Other Ambulatory Visit: Payer: Self-pay

## 2020-10-10 DIAGNOSIS — R1011 Right upper quadrant pain: Secondary | ICD-10-CM | POA: Diagnosis present

## 2020-10-10 DIAGNOSIS — K8020 Calculus of gallbladder without cholecystitis without obstruction: Secondary | ICD-10-CM | POA: Insufficient documentation

## 2020-10-10 DIAGNOSIS — K8050 Calculus of bile duct without cholangitis or cholecystitis without obstruction: Secondary | ICD-10-CM

## 2020-10-10 LAB — CBC
HCT: 30.3 % — ABNORMAL LOW (ref 36.0–46.0)
Hemoglobin: 9 g/dL — ABNORMAL LOW (ref 12.0–15.0)
MCH: 22.1 pg — ABNORMAL LOW (ref 26.0–34.0)
MCHC: 29.7 g/dL — ABNORMAL LOW (ref 30.0–36.0)
MCV: 74.3 fL — ABNORMAL LOW (ref 80.0–100.0)
Platelets: 366 10*3/uL (ref 150–400)
RBC: 4.08 MIL/uL (ref 3.87–5.11)
RDW: 16.3 % — ABNORMAL HIGH (ref 11.5–15.5)
WBC: 9 10*3/uL (ref 4.0–10.5)
nRBC: 0 % (ref 0.0–0.2)

## 2020-10-10 LAB — COMPREHENSIVE METABOLIC PANEL
ALT: 83 U/L — ABNORMAL HIGH (ref 0–44)
AST: 39 U/L (ref 15–41)
Albumin: 3.5 g/dL (ref 3.5–5.0)
Alkaline Phosphatase: 88 U/L (ref 38–126)
Anion gap: 8 (ref 5–15)
BUN: 12 mg/dL (ref 6–20)
CO2: 25 mmol/L (ref 22–32)
Calcium: 8.7 mg/dL — ABNORMAL LOW (ref 8.9–10.3)
Chloride: 105 mmol/L (ref 98–111)
Creatinine, Ser: 0.73 mg/dL (ref 0.44–1.00)
GFR, Estimated: >60 mL/min (ref >60)
Glucose, Bld: 105 mg/dL — ABNORMAL HIGH (ref 70–99)
Potassium: 3.8 mmol/L (ref 3.5–5.1)
Sodium: 138 mmol/L (ref 135–145)
Total Bilirubin: 0.3 mg/dL (ref 0.3–1.2)
Total Protein: 7.8 g/dL (ref 6.5–8.1)

## 2020-10-10 LAB — URINALYSIS, ROUTINE W REFLEX MICROSCOPIC
Bilirubin Urine: NEGATIVE
Glucose, UA: NEGATIVE mg/dL
Hgb urine dipstick: NEGATIVE
Ketones, ur: NEGATIVE mg/dL
Nitrite: NEGATIVE
Protein, ur: NEGATIVE mg/dL
Specific Gravity, Urine: 1.025 (ref 1.005–1.030)
pH: 6.5 (ref 5.0–8.0)

## 2020-10-10 LAB — URINALYSIS, MICROSCOPIC (REFLEX)

## 2020-10-10 LAB — PREGNANCY, URINE: Preg Test, Ur: NEGATIVE

## 2020-10-10 LAB — LIPASE, BLOOD: Lipase: 48 U/L (ref 11–51)

## 2020-10-10 MED ORDER — OXYCODONE-ACETAMINOPHEN 5-325 MG PO TABS
1.0000 | ORAL_TABLET | Freq: Once | ORAL | Status: AC
  Administered 2020-10-10: 1 via ORAL
  Filled 2020-10-10: qty 1

## 2020-10-10 MED ORDER — MORPHINE SULFATE (PF) 4 MG/ML IV SOLN
4.0000 mg | Freq: Once | INTRAVENOUS | Status: AC
  Administered 2020-10-10: 4 mg via INTRAVENOUS
  Filled 2020-10-10: qty 1

## 2020-10-10 MED ORDER — OXYCODONE-ACETAMINOPHEN 5-325 MG PO TABS: 1.0000 | ORAL_TABLET | Freq: Four times a day (QID) | ORAL | 0 refills | Status: AC | PRN

## 2020-10-10 MED ORDER — ONDANSETRON 4 MG PO TBDP: 4.0000 mg | ORAL_TABLET | Freq: Three times a day (TID) | ORAL | 0 refills | Status: AC | PRN

## 2020-10-10 MED ORDER — ONDANSETRON HCL 4 MG/2ML IJ SOLN
4.0000 mg | Freq: Once | INTRAMUSCULAR | Status: AC
  Administered 2020-10-10: 4 mg via INTRAVENOUS
  Filled 2020-10-10: qty 2

## 2020-10-10 NOTE — ED Provider Notes (Signed)
[facility] EMERGENCY DEPARTMENT Provider Note   CSN: [id number] Arrival date & time: 10/10/20  0134     History Chief Complaint  Patient presents with   Abdominal Pain    Karen Christensen is a 30 y.o. female.  HPI     This is a 30 year old female with a history of gallstones who presents with right upper quadrant pain, nausea, vomiting.  Patient reports that she was seen and evaluated last week for the same while on vacation.  She was told she had gallstones.  She was given amoxicillin but is not sure why.  She was not provided with any pain medication.  She had onset recurrent pain this evening.  She states she has been trying to avoid foods that provoke symptoms.  She has had multiple episodes of nonbilious, nonbloody emesis.  She reports right upper quadrant pain that is nonradiating.  She rates a 10 out of 10.  Past Medical History:  Diagnosis Date   Medical history non-contributory    Pregnancy induced hypertension     Patient Active Problem List   Diagnosis Date Noted   SVD (spontaneous vaginal delivery) 08/21/2019   Postpartum care following vaginal delivery 7/3 08/21/2019   Gestational hypertension 08/19/2019   Alpha thalassemia trait 08/19/2019   Anemia 08/19/2019   BMI 50.0-59.9, adult (HCC) 08/19/2019    Past Surgical History:  Procedure Laterality Date   NO PAST SURGERIES       OB History     Gravida  2   Para  2   Term  2   Preterm      AB      Living  2      SAB      IAB      Ectopic      Multiple  0   Live Births  2           Family History  Problem Relation Age of Onset   Asthma Sister    Cancer Maternal Grandmother    Cancer Paternal Grandmother    Stroke Neg Hx    Heart disease Neg Hx     Social History   Tobacco Use   Smoking status: Never   Smokeless tobacco: Never  Vaping Use   Vaping Use: Never used  Substance Use Topics   Alcohol use: No   Drug use: No    Home Medications Prior to Admission  medications   Medication Sig Start Date End Date Taking? Authorizing Provider  ondansetron (ZOFRAN ODT) 4 MG disintegrating tablet Take 1 tablet (4 mg total) by mouth every 8 (eight) hours as needed. 10/10/20  Yes Eldwin Volkov, Mayer Masker, MD  oxyCODONE-acetaminophen (PERCOCET/ROXICET) 5-325 MG tablet Take 1 tablet by mouth every 6 (six) hours as needed for severe pain. 10/10/20  Yes Pricilla Moehle, Mayer Masker, MD  acetaminophen (TYLENOL) 325 MG tablet Take 2 tablets (650 mg total) by mouth every 4 (four) hours as needed (for pain scale < 4). 08/22/19   [place], Lesly Rubenstein, FNP  ibuprofen (ADVIL) 600 MG tablet Take 1 tablet (600 mg total) by mouth every 6 (six) hours. 08/22/19   Dale Willows, FNP  Prenatal MV-Min-FA-Omega-3 (PRENATAL GUMMIES/DHA & FA PO) Take 1 tablet by mouth daily.    [provider]    Allergies    Bee pollen  Review of Systems   Review of Systems  Constitutional:  Negative for fever.  Respiratory:  Negative for shortness of breath.   Gastrointestinal:  Positive for abdominal pain,  nausea and vomiting. Negative for diarrhea.  Genitourinary:  Negative for dysuria.  All other systems reviewed and are negative.  Physical Exam Updated Vital Signs BP 133/70   Pulse 72   Temp 98.5 F (36.9 C) (Oral)   Resp 14   Ht 1.651 m (5\' 5" )   Wt (!) 142.9 kg   LMP 09/20/2020 (Exact Date)   SpO2 97%   BMI 52.42 kg/m   Physical Exam Vitals and nursing note reviewed.  Constitutional:      Appearance: She is well-developed. She is obese. She is not ill-appearing.  HENT:     Head: Normocephalic and atraumatic.  Eyes:     Pupils: Pupils are equal, round, and reactive to light.  Cardiovascular:     Rate and Rhythm: Normal rate and regular rhythm.     Heart sounds: Normal heart sounds.  Pulmonary:     Effort: Pulmonary effort is normal. No respiratory distress.     Breath sounds: No wheezing.  Abdominal:     General: Bowel sounds are normal.     Palpations: Abdomen is soft.      Tenderness: There is abdominal tenderness in the right upper quadrant and epigastric area.  Musculoskeletal:     Cervical back: Neck supple.  Skin:    General: Skin is warm and dry.  Neurological:     Mental Status: She is alert and oriented to person, place, and time.  Psychiatric:        Mood and Affect: Mood normal.    ED Results / Procedures / Treatments   Labs (all labs ordered are listed, but only abnormal results are displayed) Labs Reviewed  COMPREHENSIVE METABOLIC PANEL - Abnormal; Notable for the following components:      Result Value   Glucose, Bld 105 (*)    Calcium 8.7 (*)    ALT 83 (*)    All other components within normal limits  CBC - Abnormal; Notable for the following components:   Hemoglobin 9.0 (*)    HCT 30.3 (*)    MCV 74.3 (*)    MCH 22.1 (*)    MCHC 29.7 (*)    RDW 16.3 (*)    All other components within normal limits  URINALYSIS, ROUTINE W REFLEX MICROSCOPIC - Abnormal; Notable for the following components:   Leukocytes,Ua MODERATE (*)    All other components within normal limits  URINALYSIS, MICROSCOPIC (REFLEX) - Abnormal; Notable for the following components:   Bacteria, UA FEW (*)    All other components within normal limits  LIPASE, BLOOD  PREGNANCY, URINE    EKG None  Radiology No results found.  Procedures Procedures   Medications Ordered in ED Medications  morphine 4 MG/ML injection 4 mg (4 mg Intravenous Given 10/10/20 0220)  ondansetron (ZOFRAN) injection 4 mg (4 mg Intravenous Given 10/10/20 0219)  oxyCODONE-acetaminophen (PERCOCET/ROXICET) 5-325 MG per tablet 1 tablet (1 tablet Oral Given 10/10/20 10/12/20)    ED Course  I have reviewed the triage vital signs and the nursing notes.  Pertinent labs & imaging results that were available during my care of the patient were reviewed by me and considered in my medical decision making (see chart for details).    MDM Rules/Calculators/A&P                           Patient  presents with right upper quadrant abdominal pain, nausea, vomiting.  Known history of biliary colic.  Was seen last  week for the same.  Has not followed up with general surgery.  She is overall nontoxic and vital signs are reassuring.  She has right upper quadrant pain but no signs of peritonitis.  Patient was given pain and nausea medication.  Labs obtained.  No significant leukocytosis.  She has a slight elevated ALT but otherwise her liver function tests are normal and lipase is normal.  Patient had complete resolution of pain with medications.  She is able to tolerate fluids.  Highly suspect biliary colic.  Do not have access to ultrasound at this time; however, she has known gallbladder disease.  No other indication of cholecystitis.  Will discharge with pain and nausea medication and surgical referral.  Discussed with patient and her mother who are agreeable with plan.  After history, exam, and medical workup I feel the patient has been appropriately medically screened and is safe for discharge home. Pertinent diagnoses were discussed with the patient. Patient was given return precautions.  Final Clinical Impression(s) / ED Diagnoses Final diagnoses:  Biliary colic    Rx / DC Orders ED Discharge Orders          Ordered    oxyCODONE-acetaminophen (PERCOCET/ROXICET) 5-325 MG tablet  Every 6 hours PRN        10/10/20 0433    ondansetron (ZOFRAN ODT) 4 MG disintegrating tablet  Every 8 hours PRN        10/10/20 0433             Shon Baton, MD 10/10/20 [phone]

## 2020-10-10 NOTE — ED Triage Notes (Signed)
Pt reports RUQ abdominal pain & N/V onset at midnight. She was recently seen this week at ER in [facility] for same pain and was told she has gallstones. Pain has returned.

## 2020-10-10 NOTE — Discharge Instructions (Addendum)
You were seen today for ongoing gallbladder pain.  You will likely need to have gallbladder removed electively.  Avoid fatty foods or greasy fried foods.  Follow-up with general surgery.  Take medications as prescribed.  Do not drive while taking narcotic pain medication.

## 2021-07-12 ENCOUNTER — Emergency Department (HOSPITAL_BASED_OUTPATIENT_CLINIC_OR_DEPARTMENT_OTHER)
Admission: EM | Admit: 2021-07-12 | Discharge: 2021-07-12 | Disposition: A | Discharge status: Home / Self Care | Payer: 59 | Attending: Emergency Medicine | Admitting: Emergency Medicine

## 2021-07-12 ENCOUNTER — Encounter (HOSPITAL_BASED_OUTPATIENT_CLINIC_OR_DEPARTMENT_OTHER): Payer: Self-pay | Admitting: Emergency Medicine

## 2021-07-12 DIAGNOSIS — N920 Excessive and frequent menstruation with regular cycle: Secondary | ICD-10-CM | POA: Diagnosis present

## 2021-07-12 DIAGNOSIS — R42 Dizziness and giddiness: Secondary | ICD-10-CM

## 2021-07-12 DIAGNOSIS — D649 Anemia, unspecified: Secondary | ICD-10-CM | POA: Diagnosis not present

## 2021-07-12 LAB — COMPREHENSIVE METABOLIC PANEL
ALT: 17 U/L (ref 0–44)
AST: 15 U/L (ref 15–41)
Albumin: 3.4 g/dL — ABNORMAL LOW (ref 3.5–5.0)
Alkaline Phosphatase: 62 U/L (ref 38–126)
Anion gap: 6 (ref 5–15)
BUN: 13 mg/dL (ref 6–20)
CO2: 23 mmol/L (ref 22–32)
Calcium: 8.4 mg/dL — ABNORMAL LOW (ref 8.9–10.3)
Chloride: 105 mmol/L (ref 98–111)
Creatinine, Ser: 0.61 mg/dL (ref 0.44–1.00)
GFR, Estimated: >60 mL/min (ref >60)
Glucose, Bld: 95 mg/dL (ref 70–99)
Potassium: 3.6 mmol/L (ref 3.5–5.1)
Sodium: 134 mmol/L — ABNORMAL LOW (ref 135–145)
Total Bilirubin: <0.1 mg/dL — ABNORMAL LOW (ref 0.3–1.2)
Total Protein: 7.7 g/dL (ref 6.5–8.1)

## 2021-07-12 LAB — CBC WITH DIFFERENTIAL/PLATELET
Abs Immature Granulocytes: 0.04 10*3/uL (ref 0.00–0.07)
Basophils Absolute: 0 10*3/uL (ref 0.0–0.1)
Basophils Relative: 0 %
Eosinophils Absolute: 0.2 10*3/uL (ref 0.0–0.5)
Eosinophils Relative: 2 %
HCT: 29.1 % — ABNORMAL LOW (ref 36.0–46.0)
Hemoglobin: 8.8 g/dL — ABNORMAL LOW (ref 12.0–15.0)
Immature Granulocytes: 0 %
Lymphocytes Relative: 33 %
Lymphs Abs: 3.5 10*3/uL (ref 0.7–4.0)
MCH: 22.3 pg — ABNORMAL LOW (ref 26.0–34.0)
MCHC: 30.2 g/dL (ref 30.0–36.0)
MCV: 73.9 fL — ABNORMAL LOW (ref 80.0–100.0)
Monocytes Absolute: 0.9 10*3/uL (ref 0.1–1.0)
Monocytes Relative: 8 %
Neutro Abs: 5.9 10*3/uL (ref 1.7–7.7)
Neutrophils Relative %: 57 %
Platelets: 483 10*3/uL — ABNORMAL HIGH (ref 150–400)
RBC: 3.94 MIL/uL (ref 3.87–5.11)
RDW: 17 % — ABNORMAL HIGH (ref 11.5–15.5)
WBC: 10.6 10*3/uL — ABNORMAL HIGH (ref 4.0–10.5)
nRBC: 0 % (ref 0.0–0.2)

## 2021-07-12 LAB — RETICULOCYTES
Immature Retic Fract: 27 % — ABNORMAL HIGH (ref 2.3–15.9)
RBC.: 3.94 MIL/uL (ref 3.87–5.11)
Retic Count, Absolute: 54.8 10*3/uL (ref 19.0–186.0)
Retic Ct Pct: 1.4 % (ref 0.4–3.1)

## 2021-07-12 LAB — VITAMIN B12: Vitamin B-12: 500 pg/mL (ref 180–914)

## 2021-07-12 LAB — IRON AND TIBC
Iron: 107 ug/dL (ref 28–170)
Saturation Ratios: 26 % (ref 10.4–31.8)
TIBC: 409 ug/dL (ref 250–450)
UIBC: 302 ug/dL

## 2021-07-12 LAB — FOLATE: Folate: 7.9 ng/mL (ref >5.9)

## 2021-07-12 LAB — FERRITIN: Ferritin: 8 ng/mL — ABNORMAL LOW (ref 11–307)

## 2021-07-12 MED ORDER — SODIUM CHLORIDE 0.9 % IV BOLUS
2000.0000 mL | Freq: Once | INTRAVENOUS | Status: AC
  Administered 2021-07-12: 2000 mL via INTRAVENOUS

## 2021-07-12 NOTE — ED Provider Notes (Signed)
[facility] EMERGENCY DEPARTMENT Provider Note   CSN: [id number] Arrival date & time: 07/12/21  1342     History  Chief Complaint  Patient presents with   Abnormal Lab    Low HGB    Karen Christensen is a 31 y.o. female.  31 year old female with past medical history of iron deficiency anemia, and heavy menstrual cycles presents today for evaluation of heavy menstrual cycle, lightheadedness.  She states her lightheadedness, and fatigue started about 2 weeks ago and have been worse over the past 4 days since her menstrual cycle started.  She denies any change in her menstrual cycle compared to her typical menstrual cycle.  She was evaluated by PCP yesterday and had a hemoglobin of 7.4.  She was started on exogenous estrogen to help slow the bleeding down.  Today was her second dose.  She was also started on iron supplement.  She was given a referral to the hematology for IDA, and is due to see her gynecologist on 6/15.  She sees [facility].  She denies abdominal pain, or flank pain.  States she changes her pads every hour.  Denies dysuria, or other complaints.   The history is provided by the patient. No language interpreter was used.      Home Medications Prior to Admission medications   Medication Sig Start Date End Date Taking? Authorizing Provider  acetaminophen (TYLENOL) 325 MG tablet Take 2 tablets (650 mg total) by mouth every 4 (four) hours as needed (for pain scale < 4). 08/22/19   [place], Luvenia Starch, FNP  ibuprofen (ADVIL) 600 MG tablet Take 1 tablet (600 mg total) by mouth every 6 (six) hours. 08/22/19   [place], Luvenia Starch, FNP  ondansetron (ZOFRAN ODT) 4 MG disintegrating tablet Take 1 tablet (4 mg total) by mouth every 8 (eight) hours as needed. 10/10/20   Horton, Barbette Hair, MD  oxyCODONE-acetaminophen (PERCOCET/ROXICET) 5-325 MG tablet Take 1 tablet by mouth every 6 (six) hours as needed for severe pain. 10/10/20   Horton, Barbette Hair, MD  Prenatal MV-Min-FA-Omega-3  (PRENATAL GUMMIES/DHA & FA PO) Take 1 tablet by mouth daily.    [provider]      Allergies    Bee pollen    Review of Systems   Review of Systems  Constitutional:  Negative for chills and fever.  Respiratory:  Negative for shortness of breath.   Gastrointestinal:  Negative for abdominal pain and nausea.  Genitourinary:  Positive for menstrual problem and vaginal bleeding. Negative for dysuria and vaginal discharge.  All other systems reviewed and are negative.  Physical Exam Updated Vital Signs BP (!) 157/87   Pulse 76   Temp 97.7 F (36.5 C) (Oral)   Resp 19   LMP 07/08/2021 (Exact Date)   SpO2 100%  Physical Exam Vitals and nursing note reviewed.  Constitutional:      General: She is not in acute distress.    Appearance: Normal appearance. She is not ill-appearing.  HENT:     Head: Normocephalic and atraumatic.     Nose: Nose normal.  Eyes:     General: No scleral icterus.    Extraocular Movements: Extraocular movements intact.     Conjunctiva/sclera: Conjunctivae normal.  Cardiovascular:     Rate and Rhythm: Normal rate and regular rhythm.     Pulses: Normal pulses.  Pulmonary:     Effort: Pulmonary effort is normal. No respiratory distress.     Breath sounds: Normal breath sounds. No wheezing or rales.  Abdominal:     General: There is no distension.     Tenderness: There is no abdominal tenderness. There is no right CVA tenderness, left CVA tenderness or guarding.  Musculoskeletal:        General: Normal range of motion.     Cervical back: Normal range of motion.  Skin:    General: Skin is warm and dry.  Neurological:     General: No focal deficit present.     Mental Status: She is alert. Mental status is at baseline.    ED Results / Procedures / Treatments   Labs (all labs ordered are listed, but only abnormal results are displayed) Labs Reviewed  CBC WITH DIFFERENTIAL/PLATELET - Abnormal; Notable for the following components:      Result  Value   WBC 10.6 (*)    Hemoglobin 8.8 (*)    HCT 29.1 (*)    MCV 73.9 (*)    MCH 22.3 (*)    RDW 17.0 (*)    Platelets 483 (*)    All other components within normal limits  COMPREHENSIVE METABOLIC PANEL  VITAMIN 123456  FOLATE  IRON AND TIBC  FERRITIN  RETICULOCYTES    EKG None  Radiology No results found.  Procedures Procedures    Medications Ordered in ED Medications  sodium chloride 0.9 % bolus 2,000 mL (has no administration in time range)    ED Course/ Medical Decision Making/ A&P                           Medical Decision Making Amount and/or Complexity of Data Reviewed Labs: ordered.   31 year old female presents today for evaluation of lightheadedness, dizziness, heavy menstrual bleeding.  She states her menstrual cycles are always heavy and that is no different this month.  She was evaluated at her PCP office yesterday and was started on OCP.  Her point-of-care hemoglobin was 7.4.  Today she continued to feel lightheaded so she presented to the emergency room for evaluation.  She does have a gynecology appointment coming up on June 15.  She was also given a referral to hematology.  Her vitals remained stable throughout her entire emergency room stay.  She is without acute distress.  Hemoglobin today is 8.8 which is consistent with her baseline of around 9-9.5.  Believe the point-of-care may have been falsely low.  No indication for blood transfusion.  Patient reports improvement in her symptoms following 2 L fluid bolus.  Discussed pelvic exam however patient defers this at this time.  I think this is reasonable given there is been no changes to her menstrual cycle.  Iron studies were ordered however not resulted at the time of discharge.  Given patient has improvement in symptoms she is appropriate for discharge.  Labs without acute or concerning findings.  Patient voices understanding and is in agreement with plan.  Discussed calling her gynecology office to see if  she can be scheduled for a sooner appointment.   Final Clinical Impression(s) / ED Diagnoses Final diagnoses:  Lightheadedness  Anemia, unspecified type  Menorrhagia with regular cycle    Rx / DC Orders ED Discharge Orders     None         Evlyn Courier, PA-C 07/12/21 1638    Lucrezia Starch, MD 07/14/21 [phone]

## 2021-07-12 NOTE — ED Notes (Signed)
Pt verbalized understanding of d/c instructions and follow up care. Pt A&OX4 ambulatory at d/c with independent steady gait. Pt declined offer for wheelchair.

## 2021-07-12 NOTE — Discharge Instructions (Addendum)
Your work-up today was reassuring.  You received IV fluids in the emergency room with improvement in your symptoms.  Recommend you call and schedule a sooner appointment with your gynecologist.  You are referred to hematology by your primary care provider.  Ensure you schedule and keep this appointment.  Follow-up with your PCP as needed.  If you have any worsening or concerning symptoms please return to the emergency room for evaluation.  Continue taking your birth control pill.

## 2021-07-12 NOTE — ED Triage Notes (Addendum)
Pt c/o dizziness, nausea, blurry vision, fatigue x 2 weeks. Pt was seen by PCP yesterday and was found to have low hgb (7.4). Pt currently on menstrual period, started Sunday.

## 2021-07-12 NOTE — ED Notes (Signed)
Pt report given to RN Louie 

## 2021-10-29 ENCOUNTER — Other Ambulatory Visit: Payer: Self-pay | Admitting: Obstetrics and Gynecology

## 2021-12-09 ENCOUNTER — Encounter (HOSPITAL_BASED_OUTPATIENT_CLINIC_OR_DEPARTMENT_OTHER): Payer: Self-pay | Admitting: Emergency Medicine

## 2021-12-09 ENCOUNTER — Other Ambulatory Visit: Payer: Self-pay

## 2021-12-09 ENCOUNTER — Emergency Department (HOSPITAL_BASED_OUTPATIENT_CLINIC_OR_DEPARTMENT_OTHER): Payer: 59

## 2021-12-09 ENCOUNTER — Emergency Department (HOSPITAL_BASED_OUTPATIENT_CLINIC_OR_DEPARTMENT_OTHER)
Admission: EM | Admit: 2021-12-09 | Discharge: 2021-12-09 | Disposition: A | Discharge status: Home / Self Care | Payer: 59 | Attending: Emergency Medicine | Admitting: Emergency Medicine

## 2021-12-09 DIAGNOSIS — Y92410 Unspecified street and highway as the place of occurrence of the external cause: Secondary | ICD-10-CM | POA: Insufficient documentation

## 2021-12-09 DIAGNOSIS — S9031XA Contusion of right foot, initial encounter: Secondary | ICD-10-CM | POA: Insufficient documentation

## 2021-12-09 DIAGNOSIS — S99921A Unspecified injury of right foot, initial encounter: Secondary | ICD-10-CM | POA: Diagnosis present

## 2021-12-09 DIAGNOSIS — S97101A Crushing injury of unspecified right toe(s), initial encounter: Secondary | ICD-10-CM

## 2021-12-09 DIAGNOSIS — S97111A Crushing injury of right great toe, initial encounter: Secondary | ICD-10-CM | POA: Insufficient documentation
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
## 2021-12-09 NOTE — ED Notes (Signed)
Rt great toe has been hurting since Tuesday.bruising is at top of toe. Limited movement in toe
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
## 2021-12-09 NOTE — ED Notes (Signed)
ED Provider at bedside. 

## 2022-09-18 ENCOUNTER — Encounter (HOSPITAL_BASED_OUTPATIENT_CLINIC_OR_DEPARTMENT_OTHER): Payer: Self-pay | Admitting: Emergency Medicine

## 2022-09-18 ENCOUNTER — Emergency Department (HOSPITAL_BASED_OUTPATIENT_CLINIC_OR_DEPARTMENT_OTHER)
Admission: EM | Admit: 2022-09-18 | Discharge: 2022-09-18 | Disposition: A | Discharge status: Home / Self Care | Payer: 59 | Attending: Emergency Medicine | Admitting: Emergency Medicine

## 2022-09-18 ENCOUNTER — Emergency Department (HOSPITAL_BASED_OUTPATIENT_CLINIC_OR_DEPARTMENT_OTHER): Payer: 59

## 2022-09-18 ENCOUNTER — Other Ambulatory Visit: Payer: Self-pay

## 2022-09-18 DIAGNOSIS — R531 Weakness: Secondary | ICD-10-CM | POA: Insufficient documentation

## 2022-09-18 DIAGNOSIS — K0889 Other specified disorders of teeth and supporting structures: Secondary | ICD-10-CM | POA: Diagnosis not present

## 2022-09-18 DIAGNOSIS — R519 Headache, unspecified: Secondary | ICD-10-CM | POA: Insufficient documentation

## 2022-09-18 LAB — CBC WITH DIFFERENTIAL/PLATELET
Abs Immature Granulocytes: 0.02 10*3/uL (ref 0.00–0.07)
Basophils Absolute: 0.1 10*3/uL (ref 0.0–0.1)
Basophils Relative: 1 %
Eosinophils Absolute: 0.2 10*3/uL (ref 0.0–0.5)
Eosinophils Relative: 2 %
HCT: 39.2 % (ref 36.0–46.0)
Hemoglobin: 12.8 g/dL (ref 12.0–15.0)
Immature Granulocytes: 0 %
Lymphocytes Relative: 44 %
Lymphs Abs: 4 10*3/uL (ref 0.7–4.0)
MCH: 26.9 pg (ref 26.0–34.0)
MCHC: 32.7 g/dL (ref 30.0–36.0)
MCV: 82.5 fL (ref 80.0–100.0)
Monocytes Absolute: 1 10*3/uL (ref 0.1–1.0)
Monocytes Relative: 11 %
Neutro Abs: 3.8 10*3/uL (ref 1.7–7.7)
Neutrophils Relative %: 42 %
Platelets: 479 10*3/uL — ABNORMAL HIGH (ref 150–400)
RBC: 4.75 MIL/uL (ref 3.87–5.11)
RDW: 13.2 % (ref 11.5–15.5)
WBC: 9 10*3/uL (ref 4.0–10.5)
nRBC: 0 % (ref 0.0–0.2)

## 2022-09-18 LAB — BASIC METABOLIC PANEL
Anion gap: 9 (ref 5–15)
BUN: 10 mg/dL (ref 6–20)
CO2: 23 mmol/L (ref 22–32)
Calcium: 8.9 mg/dL (ref 8.9–10.3)
Chloride: 104 mmol/L (ref 98–111)
Creatinine, Ser: 0.72 mg/dL (ref 0.44–1.00)
GFR, Estimated: >60 mL/min (ref >60)
Glucose, Bld: 79 mg/dL (ref 70–99)
Potassium: 4 mmol/L (ref 3.5–5.1)
Sodium: 136 mmol/L (ref 135–145)

## 2022-09-18 MED ORDER — DIPHENHYDRAMINE HCL 50 MG/ML IJ SOLN
25.0000 mg | Freq: Once | INTRAMUSCULAR | Status: AC
Start: 2022-09-18 — End: 2022-09-18
  Administered 2022-09-18: 25 mg via INTRAVENOUS
  Filled 2022-09-18: qty 1

## 2022-09-18 MED ORDER — METOCLOPRAMIDE HCL 5 MG/ML IJ SOLN
10.0000 mg | Freq: Once | INTRAMUSCULAR | Status: AC
  Administered 2022-09-18: 10 mg via INTRAVENOUS
  Filled 2022-09-18: qty 2

## 2022-09-18 NOTE — ED Triage Notes (Signed)
Pt c/o HA x 2d; checked BP and was 165/101; no prev hx of HTN

## 2022-09-18 NOTE — ED Provider Notes (Signed)
[facility] Provider Note   CSN: [id number] Arrival date & time: 09/18/22  1743     History  Chief Complaint  Patient presents with   Headache    Karen Christensen is a 32 y.o. female.  Patient with history of anemia presents for evaluation of headache that started 2 days ago.  Symptoms started gradually and worsened.  She describes a bilateral frontal headache, currently with radiation to the right side of her head.  She has had migraines in the past but states that this feels different.  No preceding injuries.  She had blurry vision yesterday without loss of vision, resolved today.  No fevers or neck pain.  No sinus infection type symptoms or persistent tooth pain.  She tried over-the-counter medication with improvement.  She checked blood pressure at home and it was elevated to 165/101.  She was hypertensive on arrival.  She denies history of hypertension.       Home Medications Prior to Admission medications   Medication Sig Start Date End Date Taking? Authorizing Provider  acetaminophen (TYLENOL) 325 MG tablet Take 2 tablets (650 mg total) by mouth every 4 (four) hours as needed (for pain scale < 4). 08/22/19   [place], Lesly Rubenstein, FNP  ibuprofen (ADVIL) 600 MG tablet Take 1 tablet (600 mg total) by mouth every 6 (six) hours. 08/22/19   [place], Lesly Rubenstein, FNP  ondansetron (ZOFRAN ODT) 4 MG disintegrating tablet Take 1 tablet (4 mg total) by mouth every 8 (eight) hours as needed. 10/10/20   Horton, Mayer Masker, MD  oxyCODONE-acetaminophen (PERCOCET/ROXICET) 5-325 MG tablet Take 1 tablet by mouth every 6 (six) hours as needed for severe pain. 10/10/20   Horton, Mayer Masker, MD  Prenatal MV-Min-FA-Omega-3 (PRENATAL GUMMIES/DHA & FA PO) Take 1 tablet by mouth daily.    [provider]      Allergies    Bee pollen    Review of Systems   Review of Systems  Physical Exam Updated Vital Signs BP (!) 182/119 (BP Location: Right Arm)   Pulse 91    Temp 98.2 F (36.8 C)   Resp 18   Ht 5\' 5"  (1.651 m)   Wt (!) 158.8 kg   LMP 09/18/2022   SpO2 100%   BMI 58.24 kg/m  Physical Exam Vitals and nursing note reviewed.  Constitutional:      Appearance: She is well-developed.  HENT:     Head: Normocephalic and atraumatic.     Right Ear: Tympanic membrane, ear canal and external ear normal.     Left Ear: Tympanic membrane, ear canal and external ear normal.     Nose: Nose normal.     Mouth/Throat:     Pharynx: Uvula midline.  Eyes:     General: Lids are normal. No visual field deficit.    Extraocular Movements:     Right eye: No nystagmus.     Left eye: No nystagmus.     Conjunctiva/sclera: Conjunctivae normal.     Pupils: Pupils are equal, round, and reactive to light.  Cardiovascular:     Rate and Rhythm: Normal rate and regular rhythm.  Pulmonary:     Effort: Pulmonary effort is normal.     Breath sounds: Normal breath sounds.  Abdominal:     Palpations: Abdomen is soft.     Tenderness: There is no abdominal tenderness.  Musculoskeletal:     Cervical back: Normal range of motion and neck supple. No tenderness or bony tenderness.  Skin:    General: Skin is warm and dry.  Neurological:     Mental Status: She is alert and oriented to person, place, and time.     GCS: GCS eye subscore is 4. GCS verbal subscore is 5. GCS motor subscore is 6.     Cranial Nerves: No cranial nerve deficit, dysarthria or facial asymmetry.     Sensory: No sensory deficit.     Motor: Weakness present.     Coordination: Coordination normal.     Gait: Gait normal.     Comments: Patient with inconsistent right upper and right lower extremity weakness on exam, mild.     ED Results / Procedures / Treatments   Labs (all labs ordered are listed, but only abnormal results are displayed) Labs Reviewed  CBC WITH DIFFERENTIAL/PLATELET - Abnormal; Notable for the following components:      Result Value   Platelets 479 (*)    All other components  within normal limits  BASIC METABOLIC PANEL    EKG None  Radiology CT Head Wo Contrast  Result Date: 09/18/2022 CLINICAL DATA:  Headache. EXAM: CT HEAD WITHOUT CONTRAST TECHNIQUE: Contiguous axial images were obtained from the base of the skull through the vertex without intravenous contrast. RADIATION DOSE REDUCTION: This exam was performed according to the departmental dose-optimization program which includes automated exposure control, adjustment of the mA and/or kV according to patient size and/or use of iterative reconstruction technique. COMPARISON:  None Available. FINDINGS: Brain: No evidence of acute infarction, hemorrhage, hydrocephalus, extra-axial collection or mass lesion/mass effect. Vascular: No hyperdense vessel or unexpected calcification. Skull: Normal. Negative for fracture or focal lesion. Sinuses/Orbits: No acute finding. Other: None. IMPRESSION: Normal head CT. Electronically Signed   By: Aram Candela M.D.   On: 09/18/2022 19:18    Procedures Procedures    Medications Ordered in ED Medications  metoCLOPramide (REGLAN) injection 10 mg (10 mg Intravenous Given 09/18/22 2027)  diphenhydrAMINE (BENADRYL) injection 25 mg (25 mg Intravenous Given 09/18/22 2027)    ED Course/ Medical Decision Making/ A&P    Patient seen and examined. History obtained directly from patient.   Labs/EKG: Ordered CBC, BMP.  Imaging: Ordered CT head noncontrast.  Medications/Fluids: Ordered: IV Reglan, IV Benadryl, IV fluid bolus.   Most recent vital signs reviewed and are as follows: BP (!) 182/119 (BP Location: Right Arm)   Pulse 91   Temp 98.2 F (36.8 C)   Resp 18   Ht 5\' 5"  (1.651 m)   Wt (!) 158.8 kg   LMP 09/18/2022   SpO2 100%   BMI 58.24 kg/m   Initial impression: Headache  8:59 PM Reassessment performed. Patient appears stable.  On reexam, normal upper and lower extremity strength bilaterally.  States that her headache is improved.  Labs personally reviewed and  interpreted including: CBC with normal white blood cell count and hemoglobin; BMP is unremarkable.  Imaging personally visualized and interpreted including: CT head, agree negative.  Reviewed pertinent lab work and imaging with patient at bedside. Questions answered.   Most current vital signs reviewed and are as follows: BP (!) 142/98 (BP Location: Right Arm)   Pulse 70   Temp 98.3 F (36.8 C) (Oral)   Resp 18   Ht 5\' 5"  (1.651 m)   Wt (!) 158.8 kg   LMP 09/18/2022   SpO2 100%   BMI 58.24 kg/m   Plan: Discharge to home.   Prescriptions written for: None  Other home care instructions discussed: Rest, avoidance of  triggers  ED return instructions discussed: Patient counseled to return if they have weakness in their arms or legs, slurred speech, trouble walking or talking, confusion, trouble with their balance, or if they have any other concerns. Patient verbalizes understanding and agrees with plan.   Follow-up instructions discussed: Patient encouraged to follow-up with their PCP in 2 days.                                 Medical Decision Making Amount and/or Complexity of Data Reviewed Labs: ordered. Radiology: ordered.  Risk Prescription drug management.   In regards to the patient's headache, critical differentials were considered including subarachnoid hemorrhage, intracerebral hemorrhage, epidural/subdural hematoma, pituitary apoplexy, vertebral/carotid artery dissection, giant cell arteritis, central venous thrombosis, reversible cerebral vasoconstriction, acute angle closure glaucoma, idiopathic intracranial hypertension, bacterial meningitis, viral encephalitis, carbon monoxide poisoning, posterior reversible encephalopathy syndrome, pre-eclampsia.   Reg flag symptoms related to these causes were considered including systemic symptoms (fever, weight loss), neurologic symptoms (confusion, mental status change, vision change, associated seizure), acute or sudden  "thunderclap" onset, patient age 76 or older with new or progressive headache, patient of any age with first headache or change in headache pattern, pregnant or postpartum status, history of HIV or other immunocompromise, history of cancer, headache occurring with exertion, associated neck or shoulder pain, associated traumatic injury, concurrent use of anticoagulation, family history of spontaneous SAH, and concurrent drug use.    Other benign, more common causes of headache were considered including migraine, tension-type headache, cluster headache, referred pain from other cause such as sinus infection, dental pain, trigeminal neuralgia.   On exam, patient has a reassuring neuro exam including baseline mental status, no significant neck pain or meningeal signs, no signs of severe infection or fever.  She did have some inconsistent right-sided weakness at time of initial exam, however resolved with headache.  This time I have low concern for acute stroke.  Improvement of symptoms raises concern for complex migraine.  The patient's vital signs, pertinent lab work and imaging were reviewed and interpreted as discussed in the ED course. Hospitalization was considered for further testing, treatments, or serial exams/observation. However as patient is well-appearing, has a stable exam over the course of their evaluation, and reassuring studies today, I do not feel that they warrant admission at this time. This plan was discussed with the patient who verbalizes agreement and comfort with this plan and seems reliable and able to return to the Emergency Department with worsening or changing symptoms.          Final Clinical Impression(s) / ED Diagnoses Final diagnoses:  Acute nonintractable headache, unspecified headache type    Rx / DC Orders ED Discharge Orders     None         Renne Crigler, Cordelia Poche 09/18/22 2100    Lonell Grandchild, MD 09/20/22 [phone]

## 2022-09-18 NOTE — ED Notes (Signed)
Patient transported to CT 

## 2022-09-18 NOTE — Discharge Instructions (Signed)
Please read and follow all provided instructions.  Your diagnoses today include:  1. Acute nonintractable headache, unspecified headache type     Tests performed today include: CT of your head which was normal and did not show any serious cause of your headache Blood cell counts and electrolytes did not show anemia or signs of other significant problems today Vital signs. See below for your results today.   Medications:  In the Emergency Department you received: Reglan - antinausea/headache medication Benadryl - antihistamine to counteract potential side effects of reglan  Take any prescribed medications only as directed.  Additional information:  Follow any educational materials contained in this packet.  You are having a headache. No specific cause was found today for your headache. It may have been a migraine or other cause of headache. Stress, anxiety, fatigue, and depression are common triggers for headaches.   Your headache today does not appear to be life-threatening or require hospitalization, but often the exact cause of headaches is not determined in the emergency department. Therefore, follow-up with your doctor is very important to find out what may have caused your headache and whether or not you need any further diagnostic testing or treatment.   Sometimes headaches can appear benign (not harmful), but then more serious symptoms can develop which should prompt an immediate re-evaluation by your doctor or the emergency department.  BE VERY CAREFUL not to take multiple medicines containing Tylenol (also called acetaminophen). Doing so can lead to an overdose which can damage your liver and cause liver failure and possibly death.   Follow-up instructions: Please follow-up with your primary care provider in the next 3 days for further evaluation of your symptoms.   Return instructions:  Please return to the Emergency Department if you experience worsening symptoms. Return  if the medications do not resolve your headache, if it recurs, or if you have multiple episodes of vomiting or cannot keep down fluids. Return if you have a change from the usual headache. RETURN IMMEDIATELY IF you: Develop a sudden, severe headache Develop confusion or become poorly responsive or faint Develop a fever above 100.45F or problem breathing Have a change in speech, vision, swallowing, or understanding Develop new weakness, numbness, tingling, incoordination in your arms or legs Have a seizure Please return if you have any other emergent concerns.  Additional Information:  Your vital signs today were: BP (!) 142/98 (BP Location: Right Arm)   Pulse 70   Temp 98.3 F (36.8 C) (Oral)   Resp 18   Ht 5\' 5"  (1.651 m)   Wt (!) 158.8 kg   LMP 09/18/2022   SpO2 100%   BMI 58.24 kg/m  If your blood pressure (BP) was elevated above 135/85 this visit, please have this repeated by your doctor within one month. --------------

## 2023-06-13 ENCOUNTER — Other Ambulatory Visit: Payer: Self-pay

## 2023-06-13 ENCOUNTER — Encounter (HOSPITAL_BASED_OUTPATIENT_CLINIC_OR_DEPARTMENT_OTHER): Payer: Self-pay | Admitting: Urology

## 2023-06-13 ENCOUNTER — Emergency Department (HOSPITAL_BASED_OUTPATIENT_CLINIC_OR_DEPARTMENT_OTHER)
Admission: EM | Admit: 2023-06-13 | Discharge: 2023-06-13 | Disposition: A | Discharge status: Home / Self Care | Attending: Emergency Medicine | Admitting: Emergency Medicine

## 2023-06-13 DIAGNOSIS — N939 Abnormal uterine and vaginal bleeding, unspecified: Secondary | ICD-10-CM | POA: Diagnosis present

## 2023-06-13 LAB — COMPREHENSIVE METABOLIC PANEL WITH GFR
ALT: 26 U/L (ref 0–44)
AST: 18 U/L (ref 15–41)
Albumin: 4 g/dL (ref 3.5–5.0)
Alkaline Phosphatase: 41 U/L (ref 38–126)
Anion gap: 14 (ref 5–15)
BUN: 10 mg/dL (ref 6–20)
CO2: 18 mmol/L — ABNORMAL LOW (ref 22–32)
Calcium: 8.9 mg/dL (ref 8.9–10.3)
Chloride: 106 mmol/L (ref 98–111)
Creatinine, Ser: 0.7 mg/dL (ref 0.44–1.00)
GFR, Estimated: >60 mL/min (ref >60)
Glucose, Bld: 92 mg/dL (ref 70–99)
Potassium: 3.9 mmol/L (ref 3.5–5.1)
Sodium: 138 mmol/L (ref 135–145)
Total Bilirubin: <0.2 mg/dL (ref 0.0–1.2)
Total Protein: 7.2 g/dL (ref 6.5–8.1)

## 2023-06-13 LAB — CBC WITH DIFFERENTIAL/PLATELET
Abs Immature Granulocytes: 0.03 10*3/uL (ref 0.00–0.07)
Basophils Absolute: 0 10*3/uL (ref 0.0–0.1)
Basophils Relative: 1 %
Eosinophils Absolute: 0.1 10*3/uL (ref 0.0–0.5)
Eosinophils Relative: 2 %
HCT: 34.7 % — ABNORMAL LOW (ref 36.0–46.0)
Hemoglobin: 11.3 g/dL — ABNORMAL LOW (ref 12.0–15.0)
Immature Granulocytes: 0 %
Lymphocytes Relative: 45 %
Lymphs Abs: 3.8 10*3/uL (ref 0.7–4.0)
MCH: 27.3 pg (ref 26.0–34.0)
MCHC: 32.6 g/dL (ref 30.0–36.0)
MCV: 83.8 fL (ref 80.0–100.0)
Monocytes Absolute: 0.6 10*3/uL (ref 0.1–1.0)
Monocytes Relative: 7 %
Neutro Abs: 3.9 10*3/uL (ref 1.7–7.7)
Neutrophils Relative %: 45 %
Platelets: 425 10*3/uL — ABNORMAL HIGH (ref 150–400)
RBC: 4.14 MIL/uL (ref 3.87–5.11)
RDW: 13.1 % (ref 11.5–15.5)
WBC: 8.5 10*3/uL (ref 4.0–10.5)
nRBC: 0 % (ref 0.0–0.2)

## 2023-06-13 LAB — PREGNANCY, URINE: Preg Test, Ur: NEGATIVE

## 2023-06-13 MED ORDER — NORETHINDRONE ACETATE 5 MG PO TABS: 5.0000 mg | ORAL_TABLET | Freq: Three times a day (TID) | ORAL | 0 refills | Status: AC

## 2023-06-13 MED ORDER — NORETHINDRONE ACETATE 5 MG PO TABS: 10.0000 mg | ORAL_TABLET | Freq: Three times a day (TID) | ORAL | 0 refills | Status: DC

## 2023-06-13 NOTE — ED Triage Notes (Signed)
 Pt states had virtual visit and was told to come to ER  States Vaginal bleeding x 2 years, worse over past 2 weeks  State has been passing larger clots and feeling more fatigued   Has OB appointment on 5/7, out of birthcontrol

## 2023-06-13 NOTE — Discharge Instructions (Addendum)
 Return to the emergency department if symptoms worsen, you experience dizziness/fainting, or if you develop a fever.  Keep scheduled appointment with OB/GYN for May 7.  Restart 5 mg norethindrone three times daily for 5 days. Follow-up with PCP next week.

## 2023-06-13 NOTE — ED Provider Notes (Signed)
 [name] [facility] Provider Note   CSN: [id number] Arrival date & time: 06/13/23  2038     History  Chief Complaint  Patient presents with   Vaginal Bleeding    Karen Christensen is a 33 y.o. female.  33 year old female presenting with vaginal bleeding.  Patient reports vaginal bleeding has been ongoing for about 2 years, however over the past 2 weeks bleeding has become much worse/is now associated with cramping, and tonight she describes passing 3 large clots which prompted her to come to the emergency department.  She has been seen by OB/GYN in the past for similar issue, they have her taking norethindrone  OCP's 3x daily, however she has nearly run out of her prescription and has cut back to once daily as of this Monday.  She has had extensive vaginal bleeding workup in the past, including transvaginal ultrasound and D&C procedure, she cannot recall any specific diagnosis that has been made to explain this ongoing issue.  History of abnormal Pap smear, HPV. Patient has not seen her OB/GYN in over a year, she has appointment scheduled with new provider on May 7.   Patient does not believe she is pregnant.   Vaginal Bleeding Associated symptoms: no fever        Home Medications Prior to Admission medications   Medication Sig Start Date End Date Taking? Authorizing Provider  acetaminophen  (TYLENOL ) 325 MG tablet Take 2 tablets (650 mg total) by mouth every 4 (four) hours as needed (for pain scale < 4). 08/22/19   [place] , Jade, FNP  ibuprofen  (ADVIL ) 600 MG tablet Take 1 tablet (600 mg total) by mouth every 6 (six) hours. 08/22/19   [place] , Jade, FNP  norethindrone  (AYGESTIN ) 5 MG tablet Take 1 tablet (5 mg total) by mouth in the morning, at noon, and at bedtime for 5 days. 06/13/23 06/18/23  Kendrick Pax, PA-C  ondansetron  (ZOFRAN  ODT) 4 MG disintegrating tablet Take 1 tablet (4 mg total) by mouth every 8 (eight) hours as needed. 10/10/20   Horton,  Vonzella Guernsey, MD  oxyCODONE -acetaminophen  (PERCOCET/ROXICET) 5-325 MG tablet Take 1 tablet by mouth every 6 (six) hours as needed for severe pain. 10/10/20   Horton, Vonzella Guernsey, MD  Prenatal MV-Min-FA-Omega-3 (PRENATAL GUMMIES/DHA & FA PO) Take 1 tablet by mouth daily.    [provider]      Allergies    Bee pollen    Review of Systems   Review of Systems  Constitutional:  Negative for fever.  Genitourinary:  Positive for vaginal bleeding.  Neurological:  Positive for light-headedness.    Physical Exam Updated Vital Signs BP (!) 161/102 (BP Location: Left Wrist)   Pulse 86   Temp 98 F (36.7 C) (Oral)   Resp (!) 22   Ht 5\' 5"  (1.651 m)   Wt (!) 158.8 kg   SpO2 97%   BMI 58.26 kg/m    Physical Exam Vitals and nursing note reviewed. Exam conducted with a chaperone present.  Constitutional:      Appearance: She is not ill-appearing.  HENT:     Head: Normocephalic.  Eyes:     Extraocular Movements: Extraocular movements intact.  Cardiovascular:     Rate and Rhythm: Normal rate.     Heart sounds: Normal heart sounds.  Pulmonary:     Effort: Pulmonary effort is normal.     Breath sounds: Normal breath sounds.  Abdominal:     Palpations: Abdomen is soft.     Comments:  Mild tenderness to deep palpation over suprapubic area  Genitourinary:    General: Normal vulva.     Labia:        Right: No lesion or injury.        Left: No lesion or injury.      Vagina: No foreign body. Bleeding (blood visible in vaginal canal) present.     Cervix: Cervical bleeding (dark red blood exiting cervical os, no pooling of blood in the posterior vaginal fornix) present. No cervical motion tenderness, friability or lesion.  Skin:    General: Skin is warm and dry.  Neurological:     Mental Status: She is alert.     ED Results / Procedures / Treatments   Labs (all labs ordered are listed, but only abnormal results are displayed) Labs Reviewed  CBC WITH DIFFERENTIAL/PLATELET -  Abnormal; Notable for the following components:      Result Value   Hemoglobin 11.3 (*)    HCT 34.7 (*)    Platelets 425 (*)    All other components within normal limits  COMPREHENSIVE METABOLIC PANEL WITH GFR - Abnormal; Notable for the following components:   CO2 18 (*)    All other components within normal limits  PREGNANCY, URINE    EKG None  Radiology No results found.  Procedures Procedures    Medications Ordered in ED Medications - No data to display  ED Course/ Medical Decision Making/ A&P                                 Medical Decision Making This patient presents to the ED for concern of heavy vaginal bleeding, this involves an extensive number of treatment options, and is a complaint that carries with it a high risk of complications and morbidity.  The differential diagnosis includes pregnancy, polyps, adenomyosis, leiomyoma, coagulopathy.   Co morbidities that complicate the patient evaluation  History of alpha thalassemia, anemia requiring blood transfusion   Additional history obtained:  External records from outside source obtained and reviewed including most recent ACP note and telehealth visit from earlier today.   Lab Tests:  I Ordered, and personally interpreted labs.  The pertinent results include: CMP unremarkable, CBC notable for hemoglobin of 11.3, pregnancy test negative.   Problem List / ED Course / Critical interventions / Medication management  I have reviewed the patients home medicines and have made adjustments as needed   Test / Admission - Considered:  Physical exam is notable for dark red blood with small clots that appear to be coming out of the cervical os, no pooling of blood in the posterior vaginal fornix.  No other abnormalities noted of the cervix, vagina, vulva.  Patient has new patient appointment scheduled with OB/GYN for May 7.  Labs are reassuring, hemoglobin of 11.3, urine pregnancy negative.  Spoke with attending  physician regarding treatment plan, will restart 5mg  norethindrone  x 5 days for heavy vaginal bleeding.  Return precautions discussed, follow-up with PCP early next week, keep scheduled appointment with OB/GYN for May 7.    Amount and/or Complexity of Data Reviewed External Data Reviewed: notes. Labs: ordered.  Risk Prescription drug management.           Final Clinical Impression(s) / ED Diagnoses Final diagnoses:  Vaginal bleeding    Rx / DC Orders ED Discharge Orders          Ordered    norethindrone  (AYGESTIN ) 5 MG  tablet  3 times daily,   Status:  Discontinued        06/13/23 2315    norethindrone  (AYGESTIN ) 5 MG tablet  3 times daily        06/13/23 2320              Kendrick Pax, PA-C 06/13/23 2328    Owen Blowers P, DO 06/21/23 2326
